# Patient Record
Sex: Female | Born: 1965 | Race: White | Hispanic: No | Marital: Married
Health system: Midwestern US, Community
[De-identification: ages and names within clinical notes are randomized; demographics above are authoritative.]

## PROBLEM LIST (undated history)

## (undated) DIAGNOSIS — R1011 Right upper quadrant pain: Secondary | ICD-10-CM

## (undated) DIAGNOSIS — K812 Acute cholecystitis with chronic cholecystitis: Secondary | ICD-10-CM

## (undated) DIAGNOSIS — N319 Neuromuscular dysfunction of bladder, unspecified: Secondary | ICD-10-CM

## (undated) DIAGNOSIS — K760 Fatty (change of) liver, not elsewhere classified: Secondary | ICD-10-CM

## (undated) DIAGNOSIS — K5792 Diverticulitis of intestine, part unspecified, without perforation or abscess without bleeding: Secondary | ICD-10-CM

## (undated) HISTORY — PX: COLON SURGERY: SHX602

## (undated) HISTORY — PX: CHOLECYSTECTOMY: SHX55

---

## 2019-06-03 ENCOUNTER — Inpatient Hospital Stay
Admit: 2019-06-03 | Discharge: 2019-06-04 | Disposition: A | Discharge status: Home / Self Care | Payer: PRIVATE HEALTH INSURANCE | Attending: Medical

## 2019-06-03 ENCOUNTER — Emergency Department: Admit: 2019-06-03 | Payer: PRIVATE HEALTH INSURANCE | Primary: Family Medicine

## 2019-06-03 DIAGNOSIS — K5900 Constipation, unspecified: Secondary | ICD-10-CM

## 2019-06-03 LAB — METABOLIC PANEL, COMPREHENSIVE
A-G Ratio: 0.8 — ABNORMAL LOW (ref 1.0–3.0)
ALT (SGPT): 32 U/L (ref 13–56)
AST (SGOT): 22 U/L (ref 15–37)
Albumin: 3.8 g/dL (ref 3.4–5.0)
Alk. phosphatase: 79 U/L (ref 45–117)
Anion gap: 7 mmol/L (ref 5–15)
BUN/Creatinine ratio: 15 (ref 12–20)
BUN: 15 MG/DL (ref 7–18)
Bilirubin, total: 0.3 mg/dL (ref 0.00–1.20)
CO2: 25 mmol/L (ref 21–32)
Calcium: 9.3 MG/DL (ref 8.5–10.1)
Chloride: 107 mmol/L (ref 98–110)
Creatinine: 1.03 MG/DL — ABNORMAL HIGH (ref 0.60–1.00)
GFR est AA: >60 mL/min/{1.73_m2} (ref >60)
GFR est non-AA: 59 mL/min/{1.73_m2} — ABNORMAL LOW (ref >60)
Glucose: 92 mg/dL (ref 70–100)
Potassium: 4.2 mmol/L (ref 3.5–5.1)
Protein, total: 8.5 g/dL — ABNORMAL HIGH (ref 6.4–8.2)
Sodium: 139 mmol/L (ref 136–145)

## 2019-06-03 LAB — UA WITH REFLEX MICRO AND CULTURE
Bilirubin: NEGATIVE
Glucose: NEGATIVE mg/dL
Ketone: NEGATIVE mg/dL
Nitrites: NEGATIVE
Protein: NEGATIVE mg/dL
Specific gravity: 1.005 (ref 1.001–1.035)
Urobilinogen: 0.2 EU/dL (ref 0.2–1.0)
pH (UA): 6 (ref 5–8)

## 2019-06-03 LAB — URINE MICROSCOPIC WITH REFLEX CULTURE
Hyaline Casts, UA: NONE SEEN /lpf
Hyaline cast: NONE SEEN /lpf

## 2019-06-03 LAB — CBC WITH AUTOMATED DIFF
ABS. BASOPHILS: 0 10*3/uL (ref 0.0–0.1)
ABS. EOSINOPHILS: 0.2 10*3/uL (ref 0.0–0.5)
ABS. IMM. GRANS.: 0 10*3/uL (ref 0.00–0.03)
ABS. LYMPHOCYTES: 2.1 10*3/uL (ref 1.2–3.7)
ABS. MONOCYTES: 0.9 10*3/uL — ABNORMAL HIGH (ref 0.2–0.8)
ABS. NEUTROPHILS: 5.3 10*3/uL (ref 1.6–6.1)
BASOPHILS: 0 % (ref 0–1.2)
EOSINOPHILS: 2 %
HCT: 39.5 % (ref 36.0–45.0)
HGB: 13.3 g/dL (ref 11.8–14.8)
IMMATURE GRANULOCYTES: 0 % (ref 0.0–0.4)
LYMPHOCYTES: 25 %
MCH: 31.1 PG (ref 25.6–32.2)
MCHC: 33.7 g/dL (ref 32.2–35.5)
MCV: 92.5 FL (ref 80.0–95.0)
MONOCYTES: 11 %
MPV: 9.4 FL (ref 9.4–12.4)
NEUTROPHILS: 62 %
PLATELET: 241 10*3/uL (ref 150–400)
RBC: 4.27 M/uL (ref 3.93–5.22)
RDW: 13.4 % (ref 11.6–14.4)
WBC: 8.5 10*3/uL (ref 4.0–10.0)

## 2019-06-03 LAB — LIPASE
Lipase: 179 U/L (ref 73–393)
Lipase: 179 U/L (ref 73–393)

## 2019-06-03 LAB — URINALYSIS WITH REFLEX TO CULTURE
Bilirubin, Urine: NEGATIVE
Glucose, Ur: NEGATIVE mg/dL
Ketones, Urine: NEGATIVE mg/dL
Nitrite, Urine: NEGATIVE
Protein, UA: NEGATIVE mg/dL
Specific Gravity, UA: 1.005 NA (ref 1.001–1.035)
Urobilinogen, UA, POCT: 0.2 EU/dL (ref 0.2–1.0)
pH, UA: 6 NA (ref 5–8)

## 2019-06-03 LAB — CBC WITH AUTO DIFFERENTIAL
Basophils %: 0 % (ref 0–1.2)
Basophils Absolute: 0 10*3/uL (ref 0.0–0.1)
Eosinophils %: 2 %
Eosinophils Absolute: 0.2 10*3/uL (ref 0.0–0.5)
Granulocyte Absolute Count: 0 10*3/uL (ref 0.00–0.03)
Hematocrit: 39.5 % (ref 36.0–45.0)
Hemoglobin: 13.3 g/dL (ref 11.8–14.8)
Immature Granulocytes: 0 % (ref 0.0–0.4)
Lymphocytes %: 25 %
Lymphocytes Absolute: 2.1 10*3/uL (ref 1.2–3.7)
MCH: 31.1 PG (ref 25.6–32.2)
MCHC: 33.7 g/dL (ref 32.2–35.5)
MCV: 92.5 FL (ref 80.0–95.0)
MPV: 9.4 FL (ref 9.4–12.4)
Monocytes %: 11 %
Monocytes Absolute: 0.9 10*3/uL — ABNORMAL HIGH (ref 0.2–0.8)
Neutrophils %: 62 %
Neutrophils Absolute: 5.3 10*3/uL (ref 1.6–6.1)
Platelets: 241 10*3/uL (ref 150–400)
RBC: 4.27 M/uL (ref 3.93–5.22)
RDW: 13.4 % (ref 11.6–14.4)
WBC: 8.5 10*3/uL (ref 4.0–10.0)

## 2019-06-03 LAB — COMPREHENSIVE METABOLIC PANEL
ALT: 32 U/L (ref 13–56)
AST: 22 U/L (ref 15–37)
Albumin/Globulin Ratio: 0.8 — ABNORMAL LOW (ref 1.0–3.0)
Albumin: 3.8 g/dL (ref 3.4–5.0)
Alkaline Phosphatase: 79 U/L (ref 45–117)
Anion Gap: 7 mmol/L (ref 5–15)
BUN: 15 MG/DL (ref 7–18)
Bun/Cre Ratio: 15 NA (ref 12–20)
CO2: 25 mmol/L (ref 21–32)
Calcium: 9.3 MG/DL (ref 8.5–10.1)
Chloride: 107 mmol/L (ref 98–110)
Creatinine: 1.03 MG/DL — ABNORMAL HIGH (ref 0.60–1.00)
EGFR IF NonAfrican American: 59 mL/min/{1.73_m2} — ABNORMAL LOW (ref >60)
GFR African American: >60 mL/min/{1.73_m2} (ref >60)
Glucose: 92 mg/dL (ref 70–100)
Potassium: 4.2 mmol/L (ref 3.5–5.1)
Sodium: 139 mmol/L (ref 136–145)
Total Bilirubin: 0.3 mg/dL (ref 0.00–1.20)
Total Protein: 8.5 g/dL — ABNORMAL HIGH (ref 6.4–8.2)

## 2019-06-03 MED ORDER — IOHEXOL 350 MG IODINE/ML INTRAVENOUS SOLUTION
350 mg iodine/mL | Freq: Once | INTRAVENOUS | Status: AC
Start: 2019-06-03 — End: 2019-06-03
  Administered 2019-06-03: 22:00:00 via INTRAVENOUS

## 2019-06-03 MED FILL — OMNIPAQUE 350 MG IODINE/ML INTRAVENOUS SOLUTION: 350 mg iodine/mL | INTRAVENOUS | Qty: 90

## 2019-06-03 NOTE — Progress Notes (Signed)
Message left for patient to call back

## 2019-06-03 NOTE — ED Notes (Signed)
PT reports near complete emptying after Miralax cleanse last Weds./Thrusday. PT states she had no BMs over the weekend, and only a small one today. HX diverticulosis with resection last year. PT reports abd pain since yesterday, RLQ, RUQ, and LLQ.     Labs sent per orders and PT updated on plan of care. No other needs at this time. Call light in reach.

## 2019-06-03 NOTE — ED Notes (Signed)
Verbal shift change report given to Christine RN (oncoming nurse) by Lynn RN (offgoing nurse). Report included the following information SBAR and ED Summary.

## 2019-06-03 NOTE — Progress Notes (Signed)
Patient has a neurogenic bladder and does self catheterize for urine.  Her ED visit was for constipation without urinary complaints and she was not placed on antibiotics.  Urine culture in progress.

## 2019-06-03 NOTE — ED Notes (Signed)
Discharge instructions reviewed with pt and pt verbalized understanding, denies any questions. PIV removed without issue. All belongings with pt and pt ambulatory out to front to drive self home.

## 2019-06-03 NOTE — ED Notes (Unsigned)
ED Provider Triage Note    Debra Oconnell, 53 y.o. female  Chief Complaint   Patient presents with   ??? Abdominal Pain         History  Pt is a 53yo F with PMH of diverticulitis who presents with complaints of abdominal pain for weeks. Reports that she has an xray last Monday and was told that she was constipated. Did miralax cleanse with some relief. Denies NVD, urinary sxs,or fevers.       There were no vitals taken for this visit.    Patient alert oriented x3 and in no acute distress  Head is atraumatic normocephalic.  Pupils equal round reactive to light.  Extraocular movements grossly intact.  Neck is supple with no tracheal deviation or JVD.  No respiratory distress, no retractions, normal respiratory rate.  Heart rate is normal   Abdomen TTP RUQ, RLQ, LUQ, and LLQ. Middle of abdomen (epigastric/hypogastric areas) are spared. No CVA TTP bilaterally.   Normally ambulation with no gross focal neurological deficits    Plan    Basic lab work including CBC/Chem7/LFTS. Urine analysis. May need further imaging as KUB vs CT.   No orders of the defined types were placed in this encounter.

## 2019-06-03 NOTE — ED Notes (Signed)
Patient here with complaints of 4/10 abdominal pain in lower abdomen and back since about couple a weeks ago. Patient states that she had a xray last Monday at [facility] and miralax cleanse on Thurs and Friday. She was told that she ws constipated, reports headache for last 2 days, groin pain and left leg pain. Patient states she has a history of diverticulitis.

## 2019-06-03 NOTE — ED Notes (Signed)
Assisting primary RN with discharge.

## 2019-06-03 NOTE — ED Provider Notes (Signed)
[facility]  Emergency Department     History of Present Illness:  53 year old female presenting ambulatory to the ED for evaluation of abdominal pain.  Patient states this has been ongoing for couple of weeks.  She was evaluated by her PCP a week ago, had an x-ray and was told she was constipated.  She therefore did a MiraLax cleanse with good result, however continues to have abdominal pain.  She states she has pain to her lower and upper abdomen with some back discomfort as well.  No fevers, chest pain or pressure, shortness of breath, cough, nausea, vomiting, urinary changes.  She has history of neurogenic bladder, self catheterizes.  She has not noticed any malodor or cloudiness.  She states she had 9 in of her colon resected secondary to diverticulitis in the past.  No postprandial pain    Past Medical History   No past medical history on file.   There is no problem list on file for this patient.     Past Surgical History   No past surgical history on file.     Social History   Social History     Tobacco Use   ??? Smoking status: Not on file   Substance Use Topics   ??? Alcohol use: Not on file      Social History     Substance and Sexual Activity   Drug Use Not on file      Family History   No family history on file.     Medications   Prior to Admission medications    Not on File     Allergies   No Known Allergies     ROS   As per HPI.  All other systems have been reviewed with the patient and are negative.       Physical Exam   VS:   Visit Vitals  BP 138/85   Pulse 84   Temp 97.5 ??F (36.4 ??C)   Resp 16   Ht 5\' 4"  (1.626 m)   Wt 86.2 kg (190 lb)   SpO2 97%   BMI 32.61 kg/m??      General: Patient is awake and alert. Patient is nontoxic appearing.   Head: The head is normocephalic and atraumatic.   ENT: Patient's airway is intact.   Neck: Supple.   Chest/Respiratory: Respiratory effort is normal.  Breath sounds are clear to auscultation bilaterally.  No wheezes or crackles.    Cardiovascular: The heart  has a regular rate and rhythm.  No murmurs.   GI/Abdomen:  Abdomen is soft nondistended.  Mild diffuse abdominal tenderness to upper and lower abdomen, sparing central abdomen.  Musculoskeletal: Full range of motion to all extremities. Patient does not have edema.   Skin: The patient's skin is intact. No rash.   Neurologic: The neurological exam shows no focal deficits.   Psychiatric:  The patient is cooperative.       Laboratory Testing   Labs Reviewed   CBC WITH AUTOMATED DIFF - Abnormal; Notable for the following components:       Result Value    ABS. MONOCYTES 0.9 (*)     All other components within normal limits   METABOLIC PANEL, COMPREHENSIVE - Abnormal; Notable for the following components:    Creatinine 1.03 (*)     GFR est non-AA 59 (*)     Protein, total 8.5 (*)     A-G Ratio 0.8 (*)     All other components within  normal limits   UA WITH REFLEX MICRO AND CULTURE - Abnormal; Notable for the following components:    Blood SMALL (*)     Leukocyte Esterase LARGE (*)     All other components within normal limits   URINE MICROSCOPIC WITH REFLEX CULTURE - Abnormal; Notable for the following components:    Bacteria TRACE (*)     All other components within normal limits   CULTURE, URINE   LIPASE       Radiology Testing   CT ABD PELV W CONT   Preliminary Result   1. MILD DIVERTICULOSIS.   2. MODERATE AMOUNT OF FECES IN THE COLON.   3. NO EVIDENCE OF ACUTE PATHOLOGY.         J:  [id number]   D:  06/03/2019 18:28:46   T:  06/03/2019 18:52:03                                           ED Course as of Jun 02 2238   Mon Jun 03, 2019   2003 CT abdomen pelvis shows mild diverticulosis without diverticulitis.  Moderate stools.  No other acute abnormality per radiologist, Dr. Si Gaul    [KM]   2011 Patient re-evaluated.  Resting comfortably.  Updated on labs and imaging.  Comfortable with plan for discharge    [KM]      ED Course User Index  [KM] Lucile Crater, [place]        MDM:  53 year old female presenting ambulatory to the  ED for evaluation of diffuse abdominal discomfort.  This has been worsening for the past couple of weeks.  She was evaluated a week ago, had x-ray which showed constipation.  She did a cleanse, continued to have this abdominal discomfort which prompted evaluation here.  She does have history of diverticulitis.  Here she is afebrile hemodynamically stable.  Was offered analgesics, declined.  Labs and imaging consistent with constipation.  No other acute findings.  She was educated about constipation measures.  She will follow up with her PCP and return if needed for worsening symptoms.  She is comfortable this plan ready for discharge well appearing in no acute distress      Diagnosis/Diagnoses     ICD-10-CM ICD-9-CM   1. Constipation, unspecified constipation type  K59.00 564.00

## 2019-06-03 NOTE — Progress Notes (Signed)
Patient called:  Will send prescription for Macrobid to [facility] in [place] instead of [facility].

## 2019-06-03 NOTE — Progress Notes (Signed)
Called and left message for pt to return call to the ED

## 2019-06-03 NOTE — ED Notes (Signed)
PT reports near complete emptying after Miralax cleanse last Weds./Thrusday. PT states she had no BMs over the weekend, and only a small one today. HX diverticulosis with resection last year. PT reports abd pain since yesterday, RLQ, RUQ, and LLQ.     Labs sent per orders and PT updated on plan of care. No other needs at this time. Call light in reach.

## 2019-06-03 NOTE — ED Triage Notes (Signed)
Patient here with complaints of 4/10 abdominal pain in lower abdomen and back since about couple a weeks ago. Patient states that she had a xray last Monday at [facility] and miralax cleanse on Thurs and Friday. She was told that she ws constipated, reports headache for last 2 days, groin pain and left leg pain. Patient states she has a history of diverticulitis.

## 2019-06-03 NOTE — Progress Notes (Signed)
Patient called:  Will send prescription for Macro[facility] to [facility] in [place] instead of [facility].

## 2019-06-03 NOTE — ED Provider Notes (Signed)
[facility]  Emergency Department     History of Present Illness:  53 year old female presenting ambulatory to the ED for evaluation of abdominal pain.  Patient states this has been ongoing for couple of weeks.  She was evaluated by her PCP a week ago, had an x-ray and was told she was constipated.  She therefore did a MiraLax cleanse with good result, however continues to have abdominal pain.  She states she has pain to her lower and upper abdomen with some back discomfort as well.  No fevers, chest pain or pressure, shortness of breath, cough, nausea, vomiting, urinary changes.  She has history of neurogenic bladder, self catheterizes.  She has not noticed any malodor or cloudiness.  She states she had 9 in of her colon resected secondary to diverticulitis in the past.  No postprandial pain    Past Medical History   No past medical history on file.   There is no problem list on file for this patient.     Past Surgical History   No past surgical history on file.     Social History   Social History     Tobacco Use   ??? Smoking status: Not on file   Substance Use Topics   ??? Alcohol use: Not on file      Social History     Substance and Sexual Activity   Drug Use Not on file      Family History   No family history on file.     Medications   Prior to Admission medications    Not on File     Allergies   No Known Allergies     ROS   As per HPI.  All other systems have been reviewed with the patient and are negative.       Physical Exam   VS:   Visit Vitals  BP 138/85   Pulse 84   Temp 97.5 ??F (36.4 ??C)   Resp 16   Ht 5\' 4"  (1.626 m)   Wt 86.2 kg (190 lb)   SpO2 97%   BMI 32.61 kg/m??      General: Patient is awake and alert. Patient is nontoxic appearing.   Head: The head is normocephalic and atraumatic.   ENT: Patient's airway is intact.   Neck: Supple.   Chest/Respiratory: Respiratory effort is normal.  Breath sounds are clear to auscultation bilaterally.  No wheezes or crackles.     Cardiovascular: The heart has a regular rate and rhythm.  No murmurs.   GI/Abdomen:  Abdomen is soft nondistended.  Mild diffuse abdominal tenderness to upper and lower abdomen, sparing central abdomen.  Musculoskeletal: Full range of motion to all extremities. Patient does not have edema.   Skin: The patient's skin is intact. No rash.   Neurologic: The neurological exam shows no focal deficits.   Psychiatric:  The patient is cooperative.       Laboratory Testing   Labs Reviewed   CBC WITH AUTOMATED DIFF - Abnormal; Notable for the following components:       Result Value    ABS. MONOCYTES 0.9 (*)     All other components within normal limits   METABOLIC PANEL, COMPREHENSIVE - Abnormal; Notable for the following components:    Creatinine 1.03 (*)     GFR est non-AA 59 (*)     Protein, total 8.5 (*)     A-G Ratio 0.8 (*)     All other components within  normal limits   UA WITH REFLEX MICRO AND CULTURE - Abnormal; Notable for the following components:    Blood SMALL (*)     Leukocyte Esterase LARGE (*)     All other components within normal limits   URINE MICROSCOPIC WITH REFLEX CULTURE - Abnormal; Notable for the following components:    Bacteria TRACE (*)     All other components within normal limits   CULTURE, URINE   LIPASE       Radiology Testing   CT ABD PELV W CONT   Preliminary Result   1. MILD DIVERTICULOSIS.   2. MODERATE AMOUNT OF FECES IN THE COLON.   3. NO EVIDENCE OF ACUTE PATHOLOGY.         J:  [id number]   D:  06/03/2019 18:28:46   T:  06/03/2019 18:52:03                                           ED Course as of Jun 02 2238   Mon Jun 03, 2019   2003 CT abdomen pelvis shows mild diverticulosis without diverticulitis.  Moderate stools.  No other acute abnormality per radiologist, Dr. Melanee Spry    [KM]   2011 Patient re-evaluated.  Resting comfortably.  Updated on labs and imaging.  Comfortable with plan for discharge    [KM]      ED Course User Index  [KM] Elnita Maxwell, [place]         MDM:  53 year old female presenting ambulatory to the ED for evaluation of diffuse abdominal discomfort.  This has been worsening for the past couple of weeks.  She was evaluated a week ago, had x-ray which showed constipation.  She did a cleanse, continued to have this abdominal discomfort which prompted evaluation here.  She does have history of diverticulitis.  Here she is afebrile hemodynamically stable.  Was offered analgesics, declined.  Labs and imaging consistent with constipation.  No other acute findings.  She was educated about constipation measures.  She will follow up with her PCP and return if needed for worsening symptoms.  She is comfortable this plan ready for discharge well appearing in no acute distress      Diagnosis/Diagnoses     ICD-10-CM ICD-9-CM   1. Constipation, unspecified constipation type  K59.00 564.00

## 2019-06-03 NOTE — ED Triage Notes (Cosign Needed)
ED Provider Triage Note    Servando Salina, 53 y.o. female  Chief Complaint   Patient presents with   ??? Abdominal Pain         History  Pt is a 53yo F with PMH of diverticulitis who presents with complaints of abdominal pain for weeks. Reports that she has an xray last Monday and was told that she was constipated. Did miralax cleanse with some relief. Denies NVD, urinary sxs,or fevers.       There were no vitals taken for this visit.    Patient alert oriented x3 and in no acute distress  Head is atraumatic normocephalic.  Pupils equal round reactive to light.  Extraocular movements grossly intact.  Neck is supple with no tracheal deviation or JVD.  No respiratory distress, no retractions, normal respiratory rate.  Heart rate is normal   Abdomen TTP RUQ, RLQ, LUQ, and LLQ. Middle of abdomen (epigastric/hypogastric areas) are spared. No CVA TTP bilaterally.   Normally ambulation with no gross focal neurological deficits    Plan    Basic lab work including CBC/Chem7/LFTS. Urine analysis. May need further imaging as KUB vs CT.   No orders of the defined types were placed in this encounter.

## 2019-06-03 NOTE — Progress Notes (Signed)
Patient has a neurogenic bladder and does self catheterize for urine.  Her ED visit was for constipation without urinary complaints and she was not placed on antibiotics.  Urine culture in progress.

## 2019-06-06 LAB — CULTURE, URINE

## 2019-06-07 MED ORDER — NITROFURANTOIN (25% MACROCRYSTAL FORM) 100 MG CAP
100 mg | ORAL_CAPSULE | Freq: Two times a day (BID) | ORAL | 0 refills | Status: DC
Start: 2019-06-07 — End: 2019-06-08

## 2019-06-08 MED ORDER — NITROFURANTOIN (25% MACROCRYSTAL FORM) 100 MG CAP
100 mg | ORAL_CAPSULE | Freq: Two times a day (BID) | ORAL | 0 refills | Status: AC
Start: 2019-06-08 — End: 2019-06-13

## 2019-06-25 ENCOUNTER — Inpatient Hospital Stay
Admit: 2019-06-25 | Discharge: 2019-06-25 | Disposition: A | Discharge status: Home / Self Care | Payer: PRIVATE HEALTH INSURANCE | Attending: Emergency Medicine

## 2019-06-25 ENCOUNTER — Emergency Department: Admit: 2019-06-25 | Payer: PRIVATE HEALTH INSURANCE | Primary: Family Medicine

## 2019-06-25 DIAGNOSIS — N3000 Acute cystitis without hematuria: Secondary | ICD-10-CM

## 2019-06-25 LAB — METABOLIC PANEL, COMPREHENSIVE
A-G Ratio: 0.8 — ABNORMAL LOW (ref 1.0–3.0)
ALT (SGPT): 96 U/L — ABNORMAL HIGH (ref 13–56)
AST (SGOT): 166 U/L — ABNORMAL HIGH (ref 15–37)
Albumin: 3.6 g/dL (ref 3.4–5.0)
Alk. phosphatase: 85 U/L (ref 45–117)
Anion gap: 10 mmol/L (ref 5–15)
BUN/Creatinine ratio: 13 (ref 12–20)
BUN: 16 MG/DL (ref 7–18)
Bilirubin, total: 0.52 mg/dL (ref 0.00–1.20)
CO2: 18 mmol/L — ABNORMAL LOW (ref 21–32)
Calcium: 9.1 MG/DL (ref 8.5–10.1)
Chloride: 112 mmol/L — ABNORMAL HIGH (ref 98–110)
Creatinine: 1.22 MG/DL — ABNORMAL HIGH (ref 0.60–1.00)
GFR est AA: 55 mL/min/{1.73_m2} — ABNORMAL LOW (ref >60)
GFR est non-AA: 49 mL/min/{1.73_m2} — ABNORMAL LOW (ref >60)
Glucose: 127 mg/dL — ABNORMAL HIGH (ref 70–100)
Potassium: 4 mmol/L (ref 3.5–5.1)
Protein, total: 7.9 g/dL (ref 6.4–8.2)
Sodium: 140 mmol/L (ref 136–145)

## 2019-06-25 LAB — CBC WITH AUTOMATED DIFF
ABS. BASOPHILS: 0.1 10*3/uL (ref 0.0–0.1)
ABS. EOSINOPHILS: 0.2 10*3/uL (ref 0.0–0.5)
ABS. IMM. GRANS.: 0 10*3/uL (ref 0.00–0.03)
ABS. LYMPHOCYTES: 2.1 10*3/uL (ref 1.2–3.7)
ABS. MONOCYTES: 0.6 10*3/uL (ref 0.2–0.8)
ABS. NEUTROPHILS: 4 10*3/uL (ref 1.6–6.1)
BASOPHILS: 1 % (ref 0–1.2)
EOSINOPHILS: 3 %
HCT: 43.2 % (ref 36.0–45.0)
HGB: 14.8 g/dL (ref 11.8–14.8)
IMMATURE GRANULOCYTES: 0 % (ref 0.0–0.4)
LYMPHOCYTES: 30 %
MCH: 31 PG (ref 25.6–32.2)
MCHC: 34.3 g/dL (ref 32.2–35.5)
MCV: 90.6 FL (ref 80.0–95.0)
MONOCYTES: 8 %
MPV: 9.5 FL (ref 9.4–12.4)
NEUTROPHILS: 58 %
PLATELET: 289 10*3/uL (ref 150–400)
RBC: 4.77 M/uL (ref 3.93–5.22)
RDW: 13.5 % (ref 11.6–14.4)
WBC: 6.9 10*3/uL (ref 4.0–10.0)

## 2019-06-25 LAB — UA WITH REFLEX MICRO AND CULTURE
Bilirubin: NEGATIVE
Glucose: NEGATIVE mg/dL
Ketone: NEGATIVE mg/dL
Nitrites: POSITIVE — AB
Protein: NEGATIVE mg/dL
Specific gravity: 1.019 (ref 1.001–1.035)
Urobilinogen: 0.2 EU/dL (ref 0.2–1.0)
pH (UA): 7.5 (ref 5–8)

## 2019-06-25 LAB — URINE MICROSCOPIC WITH REFLEX CULTURE
Casts UA: NONE SEEN /lpf
Casts: NONE SEEN /lpf
Hyaline Casts, UA: NONE SEEN /lpf
Hyaline cast: NONE SEEN /lpf

## 2019-06-25 LAB — LIPASE
Lipase: 275 U/L (ref 73–393)
Lipase: 275 U/L (ref 73–393)

## 2019-06-25 LAB — CBC WITH AUTO DIFFERENTIAL
Basophils %: 1 % (ref 0–1.2)
Basophils Absolute: 0.1 10*3/uL (ref 0.0–0.1)
Eosinophils %: 3 %
Eosinophils Absolute: 0.2 10*3/uL (ref 0.0–0.5)
Granulocyte Absolute Count: 0 10*3/uL (ref 0.00–0.03)
Hematocrit: 43.2 % (ref 36.0–45.0)
Hemoglobin: 14.8 g/dL (ref 11.8–14.8)
Immature Granulocytes: 0 % (ref 0.0–0.4)
Lymphocytes %: 30 %
Lymphocytes Absolute: 2.1 10*3/uL (ref 1.2–3.7)
MCH: 31 PG (ref 25.6–32.2)
MCHC: 34.3 g/dL (ref 32.2–35.5)
MCV: 90.6 FL (ref 80.0–95.0)
MPV: 9.5 FL (ref 9.4–12.4)
Monocytes %: 8 %
Monocytes Absolute: 0.6 10*3/uL (ref 0.2–0.8)
Neutrophils %: 58 %
Neutrophils Absolute: 4 10*3/uL (ref 1.6–6.1)
Platelets: 289 10*3/uL (ref 150–400)
RBC: 4.77 M/uL (ref 3.93–5.22)
RDW: 13.5 % (ref 11.6–14.4)
WBC: 6.9 10*3/uL (ref 4.0–10.0)

## 2019-06-25 LAB — URINALYSIS WITH REFLEX TO CULTURE
Bilirubin, Urine: NEGATIVE
Glucose, Ur: NEGATIVE mg/dL
Ketones, Urine: NEGATIVE mg/dL
Nitrite, Urine: POSITIVE — AB
Protein, UA: NEGATIVE mg/dL
Specific Gravity, UA: 1.019 NA (ref 1.001–1.035)
Urobilinogen, UA, POCT: 0.2 EU/dL (ref 0.2–1.0)
pH, UA: 7.5 NA (ref 5–8)

## 2019-06-25 LAB — COMPREHENSIVE METABOLIC PANEL
ALT: 96 U/L — ABNORMAL HIGH (ref 13–56)
AST: 166 U/L — ABNORMAL HIGH (ref 15–37)
Albumin/Globulin Ratio: 0.8 — ABNORMAL LOW (ref 1.0–3.0)
Albumin: 3.6 g/dL (ref 3.4–5.0)
Alkaline Phosphatase: 85 U/L (ref 45–117)
Anion Gap: 10 mmol/L (ref 5–15)
BUN: 16 MG/DL (ref 7–18)
Bun/Cre Ratio: 13 NA (ref 12–20)
CO2: 18 mmol/L — ABNORMAL LOW (ref 21–32)
Calcium: 9.1 MG/DL (ref 8.5–10.1)
Chloride: 112 mmol/L — ABNORMAL HIGH (ref 98–110)
Creatinine: 1.22 MG/DL — ABNORMAL HIGH (ref 0.60–1.00)
EGFR IF NonAfrican American: 49 mL/min/{1.73_m2} — ABNORMAL LOW (ref >60)
GFR African American: 55 mL/min/{1.73_m2} — ABNORMAL LOW (ref >60)
Glucose: 127 mg/dL — ABNORMAL HIGH (ref 70–100)
Potassium: 4 mmol/L (ref 3.5–5.1)
Sodium: 140 mmol/L (ref 136–145)
Total Bilirubin: 0.52 mg/dL (ref 0.00–1.20)
Total Protein: 7.9 g/dL (ref 6.4–8.2)

## 2019-06-25 MED ORDER — TRIMETHOPRIM-SULFAMETHOXAZOLE 160 MG-800 MG TAB
160-800 mg | ORAL | Status: AC
Start: 2019-06-25 — End: 2019-06-25
  Administered 2019-06-25: 17:00:00 via ORAL

## 2019-06-25 MED ORDER — MORPHINE 2 MG/ML INJECTION
2 mg/mL | Freq: Once | INTRAMUSCULAR | Status: AC
Start: 2019-06-25 — End: 2019-06-25
  Administered 2019-06-25: 13:00:00 via INTRAVENOUS

## 2019-06-25 MED ORDER — TRIMETHOPRIM-SULFAMETHOXAZOLE 160 MG-800 MG TAB
160-800 mg | ORAL_TABLET | Freq: Two times a day (BID) | ORAL | 0 refills | Status: AC
Start: 2019-06-25 — End: 2019-06-30

## 2019-06-25 MED ORDER — ONDANSETRON (PF) 4 MG/2 ML INJECTION
4 mg/2 mL | INTRAMUSCULAR | Status: AC
Start: 2019-06-25 — End: 2019-06-25
  Administered 2019-06-25: 13:00:00 via INTRAVENOUS

## 2019-06-25 MED ORDER — FAMOTIDINE (PF) 20 MG/2 ML IV
202 mg/2 mL | INTRAVENOUS | Status: AC
Start: 2019-06-25 — End: 2019-06-25
  Administered 2019-06-25: 13:00:00 via INTRAVENOUS

## 2019-06-25 MED ORDER — SODIUM CHLORIDE 0.9 % IJ SYRG
INTRAMUSCULAR | Status: DC | PRN
Start: 2019-06-25 — End: 2019-06-25

## 2019-06-25 MED ORDER — SODIUM CHLORIDE 0.9 % IJ SYRG
Freq: Three times a day (TID) | INTRAMUSCULAR | Status: DC
Start: 2019-06-25 — End: 2019-06-25

## 2019-06-25 MED ORDER — SUCRALFATE 100 MG/ML ORAL SUSP
100 mg/mL | Freq: Four times a day (QID) | ORAL | 0 refills | Status: AC
Start: 2019-06-25 — End: ?

## 2019-06-25 MED FILL — MORPHINE 2 MG/ML INJECTION: 2 mg/mL | INTRAMUSCULAR | Qty: 2

## 2019-06-25 MED FILL — TRIMETHOPRIM-SULFAMETHOXAZOLE 160 MG-800 MG TAB: 160-800 mg | ORAL | Qty: 1

## 2019-06-25 MED FILL — NORMAL SALINE FLUSH 0.9 % INJECTION SYRINGE: INTRAMUSCULAR | Qty: 10

## 2019-06-25 MED FILL — ONDANSETRON (PF) 4 MG/2 ML INJECTION: 4 mg/2 mL | INTRAMUSCULAR | Qty: 2

## 2019-06-25 MED FILL — FAMOTIDINE (PF) 20 MG/2 ML IV: 20 mg/2 mL | INTRAVENOUS | Qty: 2

## 2019-06-25 NOTE — ED Provider Notes (Signed)
53 yr old female with h /o neurogenic bladder (straight caths) and previous colon resection from diverticulitis, presents to the ED with RUQ abd pain that started about 1.5 hours ago. Pain is sharp and constant. She reports pain wraps around to her back. This is associated with nausea, no vomiting, no changes in bowel or bladder. She was seen here 2 weeks ago with similar pain, however this pain is much worse and that pain was lower in her abdomen. She did have a CT scan of the abd/pelvis which showed moderate stool. She was found to have a UTI at that time. She was educated on treatment for constipation and started on macrobid. She has been doing well since that time until this am. She did take the course of macrobid. She does not think that was helpful for changes in her urine which she does report is foul smelling. She has not taken any medications for the pain this am. No recent travel or illness. No sick contacts. Pt denies trauma. Pt denies ETOH or drug use. Pt denies HA, confusion, fevers, visual changes, neck pain, cough, CP, SOB. No recent weight loss, LAD, or rashes.              No past medical history on file.    No past surgical history on file.      No family history on file.    Social History     Socioeconomic History   ??? Marital status: MARRIED     Spouse name: Not on file   ??? Number of children: Not on file   ??? Years of education: Not on file   ??? Highest education level: Not on file   Occupational History   ??? Not on file   Social Needs   ??? Financial resource strain: Not on file   ??? Food insecurity     Worry: Not on file     Inability: Not on file   ??? Transportation needs     Medical: Not on file     Non-medical: Not on file   Tobacco Use   ??? Smoking status: Not on file   Substance and Sexual Activity   ??? Alcohol use: Not on file   ??? Drug use: Not on file   ??? Sexual activity: Not on file   Lifestyle   ??? Physical activity     Days per week: Not on file     Minutes per session: Not on file   ??? Stress:  Not on file   Relationships   ??? Social Product manager on phone: Not on file     Gets together: Not on file     Attends religious service: Not on file     Active member of club or organization: Not on file     Attends meetings of clubs or organizations: Not on file     Relationship status: Not on file   ??? Intimate partner violence     Fear of current or ex partner: Not on file     Emotionally abused: Not on file     Physically abused: Not on file     Forced sexual activity: Not on file   Other Topics Concern   ??? Not on file   Social History Narrative   ??? Not on file         ALLERGIES: Patient has no known allergies.    Review of Systems   Constitutional: Negative for activity  change, appetite change, chills, diaphoresis, fatigue, fever and unexpected weight change.   HENT: Negative for congestion, sneezing, sore throat and trouble swallowing.    Eyes: Negative for photophobia and visual disturbance.   Respiratory: Negative for cough, chest tightness and shortness of breath.    Cardiovascular: Negative for chest pain, palpitations and leg swelling.   Gastrointestinal: Positive for abdominal pain and nausea. Negative for blood in stool, constipation, diarrhea and vomiting.   Genitourinary: Negative for difficulty urinating, dysuria, flank pain, frequency, hematuria and urgency.   Musculoskeletal: Negative for back pain, joint swelling, neck pain and neck stiffness.   Skin: Negative for rash and wound.   Allergic/Immunologic: Negative.    Neurological: Negative for dizziness, syncope, weakness, light-headedness, numbness and headaches.   Hematological: Negative.    Psychiatric/Behavioral: Negative for confusion, sleep disturbance and suicidal ideas.       Vitals:    06/25/19 0743   BP: (!) 185/85   Pulse: 73   Resp: 25   Temp: 97.5 ??F (36.4 ??C)   SpO2: 98%   Weight: 86.2 kg (190 lb)   Height: '5\' 4"'  (1.626 m)            Physical Exam  Vitals signs reviewed.   Constitutional:       General: She is in acute  distress.      Appearance: She is well-developed. She is not ill-appearing, toxic-appearing or diaphoretic.   HENT:      Head: Atraumatic.      Right Ear: External ear normal.      Left Ear: External ear normal.      Mouth/Throat:      Mouth: Mucous membranes are moist.      Pharynx: No oropharyngeal exudate.   Eyes:      Extraocular Movements: Extraocular movements intact.      Conjunctiva/sclera: Conjunctivae normal.      Pupils: Pupils are equal, round, and reactive to light.   Neck:      Musculoskeletal: Normal range of motion and neck supple.   Cardiovascular:      Rate and Rhythm: Normal rate and regular rhythm.      Heart sounds: Normal heart sounds. No murmur.   Pulmonary:      Effort: Pulmonary effort is normal. No respiratory distress.      Breath sounds: Normal breath sounds. No wheezing or rales.   Abdominal:      General: Bowel sounds are decreased. There is no distension.      Palpations: Abdomen is soft. There is no mass.      Tenderness: There is abdominal tenderness (mild). There is no guarding or rebound.   Musculoskeletal: Normal range of motion.         General: No tenderness or deformity.   Lymphadenopathy:      Cervical: No cervical adenopathy.   Skin:     General: Skin is warm and dry.      Findings: No erythema or rash.   Neurological:      General: No focal deficit present.      Mental Status: She is alert and oriented to person, place, and time.      Cranial Nerves: No cranial nerve deficit.      Sensory: No sensory deficit.      Motor: No weakness.      Coordination: Coordination normal.      Deep Tendon Reflexes: Reflexes normal.   Psychiatric:         Behavior: Behavior normal.  Thought Content: Thought content normal.         Judgment: Judgment normal.          MDM  Number of Diagnoses or Management Options  Diagnosis management comments: 53 yr old female presents to ED with RUQ abd pain that started 3 days ago. Pain came on suddenly about 1.5 hours ago.  Pt is afebrile,  uncomfortable but non toxic in appearance with HTN, otherwise nl vital signs. She is alert with no AMS.   On exam, abd is soft with mild discomfort to palpation of the RUQ, no peritoneal signs making an acute abdomen unlikely. Physical exam is otherwise unremarkable.   IV access obtained and pt was started on NS. She was given morphine, pepcid and zofran for pain and nausea control.   Labs show no leukocytosis, no anemia, nl electrolytes. She has mild metabolic acidosis with a CO2 of 18. Cr is 1.22. AST is 122, ALT is 96, nl TB at 0.52. Alk phos is 85. Lipase is normal at 275.   Urine  RUQ US shows a positive murphy's sign, however no GB wall thickening or gallstones. CBD is mildly distended at 72m.     Discussed with Dr. SClide Dalesfrom general surgery who evaluated the pt here in the ED and feels pt can be discharged home to follow up with him in clinic. He recommends the pt be started on carafate. He discussed all findings and recommendations with her at length. He has also recommended GI follow up for her.      On repeat exam, she feels much improved.   Urine is positive for UTI. Will treat with bactrim as she was treated with macribid previously.   Vital signs are normal and abd is soft and non tender. Based  on this, I feel pt is safe to be discharged home with follow up with surgery and GI.  Pt given strong return precautions for fever, nausea/vomiting or increased pain. Also advised pt to have abd reevaluated in 24 hours if symptoms persist or sooner if needed. Pt discharged home in good condition. All indications for emergent re-evaluation and possible medication side effects discussed with pt prior to discharge and she expressed understanding.             Amount and/or Complexity of Data Reviewed  Clinical lab tests: reviewed  Review and summarize past medical records: yes  Discuss the patient with other providers: yes           Procedures      NIH Stroke Scale

## 2019-06-25 NOTE — Progress Notes (Signed)
Pt was started on bactrim, susceptibilities to follow

## 2019-06-25 NOTE — ED Notes (Signed)
[place] tech at bedside. Pt appears more comfortable.

## 2019-06-25 NOTE — Progress Notes (Signed)
General Surgery Consultation  Date/Time: 06/25/19, 11:00 AM  Location: [facility] Emergency Room # 7    HISTORY OF PRESENT ILLNESS  Debra Oconnell is a 53 y.o. female.  Debra Oconnell is a pleasant 53 yo female who presents to emergency room for complaint of epigastric/right upper quadrant abdominal pain of onset early this morning.  She has been seen by her primary care physician, as well as gastroenterologist, and most recently Dr.Abbis, for this similar concern.  Those notes in her chart have been reviewed and appreciated.    The pain is sharp in nature, radiating to the back, not associated with nausea or vomiting.  No fever or chills.  No other members of her family have been ill.  Due to her neurogenic bladder, the patient frequently self catheterizes, and states she noticed malodor to her urine, which may be indicative of UTI symptoms.  She does admit to frequent constipation, however stays well hydrated.  He has tried some stool softeners, but does not have a strict regimen yet.     She denies the pain being immediate onset postprandially, but due to symptoms of gastric reflux has recently been started on a course of proton pump inhibitor.  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  Of note she has a sister, whom she has states has liver failure, as well as Crohn's disease.  The patient has not had any recent endoscopy, although has had colonoscopy in the past surrounding her issue of diverticulitis, for which she has undergone left hemicolectomy.      Review of Systems   Constitutional: Negative for chills, diaphoresis, fever, malaise/fatigue and weight loss.   Respiratory: Negative for cough and shortness of breath.    Cardiovascular: Negative for chest pain and leg swelling.   Gastrointestinal: Positive for abdominal pain, constipation and heartburn. Negative for blood in  stool, diarrhea, melena, nausea and vomiting.   Genitourinary: Negative for dysuria.   Musculoskeletal: Negative for myalgias.   Neurological: Negative for weakness.     Labs reviewed from ER visit today on 06/25/19:  Normal WBC, no left shift  Urinalysis shows 4+ bacteriurea  Lipase normal, total bilirubin normal, alkaline phosphatase normal.  ALT 96, AST 166  Mildly elevated creatinine 1.22    Ultrasound right upper quadrant abdomen (I reviewed the images in person with Dr. Si GaulHu)  Mild dilation of common bile duct at 8 mm,  Normal gallbladder wall thickening, no pericholecystic fluid  There is no evidence of cholelithiasis or biliary sludge.    Physical Exam  Constitutional:       General: She is not in acute distress.     Appearance: Normal appearance. She is not toxic-appearing or diaphoretic.   HENT:      Head: Normocephalic and atraumatic.      Nose: No congestion.   Eyes:      General: No scleral icterus.     Pupils: Pupils are equal, round, and reactive to light.   Cardiovascular:      Rate and Rhythm: Regular rhythm.      Pulses: Normal pulses.   Pulmonary:      Breath sounds: Normal breath sounds. No wheezing.   Abdominal:      General: Abdomen is flat. There is no distension.      Palpations: Abdomen is soft. There is no mass.      Tenderness: There is  abdominal tenderness (Right upper quadrant, epigastrium to deep palpation.). There is no right CVA tenderness, left CVA tenderness, guarding or rebound.   Musculoskeletal:         General: No swelling.   Skin:     General: Skin is warm and dry.   Neurological:      General: No focal deficit present.      Mental Status: She is alert and oriented to person, place, and time.   Psychiatric:         Behavior: Behavior normal.         ASSESSMENT and PLAN  53 year old female with epigastric and right upper quadrant abdominal pain, likely due to 1 of several possible etiologies, and differential diagnosis includes peptic ulcer disease, Crohn's disease, biliary  dyskinesia.  There is no evidence of cholelithiasis or cholecystitis on today's evaluation.  Given the patient's prior history of colon surgery, and known constipation with neurogenic bladder issues, I suspect these are less likely related to her presentation today.    I discussed with the patient's the importance of continued workup with Gastroenterology, to involve EGD, although she has recently started a course of PPI, which she should continue.  Symptomatic relief with a trial of Carafate may be of some benefit.  More concerning is the family history of Crohn's disease, and a formal workup to include biopsy would be helpful to rule this out in this patient, especially given the age of presentation.  Lastly the role of avoiding fatty foods to minimize her symptoms, should they be biliary in nature due to any possible biliary dyskinesia would be prudent.  I explained to the patient that if her pain recurs and or persists, she should seek re-evaluation, or call our office to be further worked up electively.  I do not find indication for emergency surgery to remove her gallbladder at this time.    I spent 30 minutes counseling and advising the patient on the above diagnoses, and all her questions were answered to her satisfaction.  She understands with the plan and agrees to follow-up in the outpatient clinic.    Thank you for the opportunity to be involved in the care of this patient.

## 2019-06-25 NOTE — ED Provider Notes (Signed)
53 yr old female with h /o neurogenic bladder (straight caths) and previous colon resection from diverticulitis, presents to the ED with RUQ abd pain that started about 1.5 hours ago. Pain is sharp and constant. She reports pain wraps around to her back. This is associated with nausea, no vomiting, no changes in bowel or bladder. She was seen here 2 weeks ago with similar pain, however this pain is much worse and that pain was lower in her abdomen. She did have a CT scan of the abd/pelvis which showed moderate stool. She was found to have a UTI at that time. She was educated on treatment for constipation and started on macrobid. She has been doing well since that time until this am. She did take the course of macrobid. She does not think that was helpful for changes in her urine which she does report is foul smelling. She has not taken any medications for the pain this am. No recent travel or illness. No sick contacts. Pt denies trauma. Pt denies ETOH or drug use. Pt denies HA, confusion, fevers, visual changes, neck pain, cough, CP, SOB. No recent weight loss, LAD, or rashes.              No past medical history on file.    No past surgical history on file.      No family history on file.    Social History     Socioeconomic History   ??? Marital status: MARRIED     Spouse name: Not on file   ??? Number of children: Not on file   ??? Years of education: Not on file   ??? Highest education level: Not on file   Occupational History   ??? Not on file   Social Needs   ??? Financial resource strain: Not on file   ??? Food insecurity     Worry: Not on file     Inability: Not on file   ??? Transportation needs     Medical: Not on file     Non-medical: Not on file   Tobacco Use   ??? Smoking status: Not on file   Substance and Sexual Activity   ??? Alcohol use: Not on file   ??? Drug use: Not on file   ??? Sexual activity: Not on file   Lifestyle   ??? Physical activity     Days per week: Not on file     Minutes per session: Not on file    ??? Stress: Not on file   Relationships   ??? Social Product manager on phone: Not on file     Gets together: Not on file     Attends religious service: Not on file     Active member of club or organization: Not on file     Attends meetings of clubs or organizations: Not on file     Relationship status: Not on file   ??? Intimate partner violence     Fear of current or ex partner: Not on file     Emotionally abused: Not on file     Physically abused: Not on file     Forced sexual activity: Not on file   Other Topics Concern   ??? Not on file   Social History Narrative   ??? Not on file         ALLERGIES: Patient has no known allergies.    Review of Systems   Constitutional: Negative for activity  change, appetite change, chills, diaphoresis, fatigue, fever and unexpected weight change.   HENT: Negative for congestion, sneezing, sore throat and trouble swallowing.    Eyes: Negative for photophobia and visual disturbance.   Respiratory: Negative for cough, chest tightness and shortness of breath.    Cardiovascular: Negative for chest pain, palpitations and leg swelling.   Gastrointestinal: Positive for abdominal pain and nausea. Negative for blood in stool, constipation, diarrhea and vomiting.   Genitourinary: Negative for difficulty urinating, dysuria, flank pain, frequency, hematuria and urgency.   Musculoskeletal: Negative for back pain, joint swelling, neck pain and neck stiffness.   Skin: Negative for rash and wound.   Allergic/Immunologic: Negative.    Neurological: Negative for dizziness, syncope, weakness, light-headedness, numbness and headaches.   Hematological: Negative.    Psychiatric/Behavioral: Negative for confusion, sleep disturbance and suicidal ideas.       Vitals:    06/25/19 0743   BP: (!) 185/85   Pulse: 73   Resp: 25   Temp: 97.5 ??F (36.4 ??C)   SpO2: 98%   Weight: 86.2 kg (190 lb)   Height: 5' 4" (1.626 m)            Physical Exam  Vitals signs reviewed.   Constitutional:        General: She is in acute distress.      Appearance: She is well-developed. She is not ill-appearing, toxic-appearing or diaphoretic.   HENT:      Head: Atraumatic.      Right Ear: External ear normal.      Left Ear: External ear normal.      Mouth/Throat:      Mouth: Mucous membranes are moist.      Pharynx: No oropharyngeal exudate.   Eyes:      Extraocular Movements: Extraocular movements intact.      Conjunctiva/sclera: Conjunctivae normal.      Pupils: Pupils are equal, round, and reactive to light.   Neck:      Musculoskeletal: Normal range of motion and neck supple.   Cardiovascular:      Rate and Rhythm: Normal rate and regular rhythm.      Heart sounds: Normal heart sounds. No murmur.   Pulmonary:      Effort: Pulmonary effort is normal. No respiratory distress.      Breath sounds: Normal breath sounds. No wheezing or rales.   Abdominal:      General: Bowel sounds are decreased. There is no distension.      Palpations: Abdomen is soft. There is no mass.      Tenderness: There is abdominal tenderness (mild). There is no guarding or rebound.   Musculoskeletal: Normal range of motion.         General: No tenderness or deformity.   Lymphadenopathy:      Cervical: No cervical adenopathy.   Skin:     General: Skin is warm and dry.      Findings: No erythema or rash.   Neurological:      General: No focal deficit present.      Mental Status: She is alert and oriented to person, place, and time.      Cranial Nerves: No cranial nerve deficit.      Sensory: No sensory deficit.      Motor: No weakness.      Coordination: Coordination normal.      Deep Tendon Reflexes: Reflexes normal.   Psychiatric:         Behavior: Behavior normal.  Thought Content: Thought content normal.         Judgment: Judgment normal.          MDM  Number of Diagnoses or Management Options  Diagnosis management comments: 53 yr old female presents to ED with RUQ abd pain that started 3 days ago. Pain came on suddenly about 1.5 hours  ago.  Pt is afebrile, uncomfortable but non toxic in appearance with HTN, otherwise nl vital signs. She is alert with no AMS.   On exam, abd is soft with mild discomfort to palpation of the RUQ, no peritoneal signs making an acute abdomen unlikely. Physical exam is otherwise unremarkable.   IV access obtained and pt was started on NS. She was given morphine, pepcid and zofran for pain and nausea control.   Labs show no leukocytosis, no anemia, nl electrolytes. She has mild metabolic acidosis with a CO2 of 18. Cr is 1.22. AST is 122, ALT is 96, nl TB at 0.52. Alk phos is 85. Lipase is normal at 275.   Urine  RUQ US shows a positive murphy's sign, however no GB wall thickening or gallstones. CBD is mildly distended at 70m.     Discussed with Dr. SClide Dalesfrom general surgery who evaluated the pt here in the ED and feels pt can be discharged home to follow up with him in clinic. He recommends the pt be started on carafate. He discussed all findings and recommendations with her at length. He has also recommended GI follow up for her.      On repeat exam, she feels much improved.   Urine is positive for UTI. Will treat with bactrim as she was treated with macribid previously.   Vital signs are normal and abd is soft and non tender. Based  on this, I feel pt is safe to be discharged home with follow up with surgery and GI.  Pt given strong return precautions for fever, nausea/vomiting or increased pain. Also advised pt to have abd reevaluated in 24 hours if symptoms persist or sooner if needed. Pt discharged home in good condition. All indications for emergent re-evaluation and possible medication side effects discussed with pt prior to discharge and she expressed understanding.             Amount and/or Complexity of Data Reviewed  Clinical lab tests: reviewed  Review and summarize past medical records: yes  Discuss the patient with other providers: yes           Procedures      NIH Stroke Scale

## 2019-06-25 NOTE — ED Notes (Signed)
US tech at bedside. Pt appears more comfortable.

## 2019-06-25 NOTE — Progress Notes (Signed)
General Surgery Consultation  Date/Time: 06/25/19, 11:00 AM  Location: [facility] Emergency Room # 7    HISTORY OF PRESENT ILLNESS  Debra Oconnell is a 53 y.o. female.  Debra Oconnell is a pleasant 53 yo female who presents to emergency room for complaint of epigastric/right upper quadrant abdominal pain of onset early this morning.  She has been seen by her primary care physician, as well as gastroenterologist, and most recently Dr.Abbis, for this similar concern.  Those notes in her chart have been reviewed and appreciated.    The pain is sharp in nature, radiating to the back, not associated with nausea or vomiting.  No fever or chills.  No other members of her family have been ill.  Due to her neurogenic bladder, the patient frequently self catheterizes, and states she noticed malodor to her urine, which may be indicative of UTI symptoms.  She does admit to frequent constipation, however stays well hydrated.  He has tried some stool softeners, but does not have a strict regimen yet.     She denies the pain being immediate onset postprandially, but due to symptoms of gastric reflux has recently been started on a course of proton pump inhibitor.  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  Of note she has a sister, whom she has states has liver failure, as well as Crohn's disease.  The patient has not had any recent endoscopy, although has had colonoscopy in the past surrounding her issue of diverticulitis, for which she has undergone left hemicolectomy.      Review of Systems   Constitutional: Negative for chills, diaphoresis, fever, malaise/fatigue and weight loss.   Respiratory: Negative for cough and shortness of breath.    Cardiovascular: Negative for chest pain and leg swelling.   Gastrointestinal: Positive for abdominal pain, constipation and heartburn.  Negative for blood in stool, diarrhea, melena, nausea and vomiting.   Genitourinary: Negative for dysuria.   Musculoskeletal: Negative for myalgias.   Neurological: Negative for weakness.     Labs reviewed from ER visit today on 06/25/19:  Normal WBC, no left shift  Urinalysis shows 4+ bacteriurea  Lipase normal, total bilirubin normal, alkaline phosphatase normal.  ALT 96, AST 166  Mildly elevated creatinine 1.22    Ultrasound right upper quadrant abdomen (I reviewed the images in person with Dr. Melanee Spry)  Mild dilation of common bile duct at 8 mm,  Normal gallbladder wall thickening, no pericholecystic fluid  There is no evidence of cholelithiasis or biliary sludge.    Physical Exam  Constitutional:       General: She is not in acute distress.     Appearance: Normal appearance. She is not toxic-appearing or diaphoretic.   HENT:      Head: Normocephalic and atraumatic.      Nose: No congestion.   Eyes:      General: No scleral icterus.     Pupils: Pupils are equal, round, and reactive to light.   Cardiovascular:      Rate and Rhythm: Regular rhythm.      Pulses: Normal pulses.   Pulmonary:      Breath sounds: Normal breath sounds. No wheezing.   Abdominal:      General: Abdomen is flat. There is no distension.      Palpations: Abdomen is soft. There is no mass.      Tenderness: There is  abdominal tenderness (Right upper quadrant, epigastrium to deep palpation.). There is no right CVA tenderness, left CVA tenderness, guarding or rebound.   Musculoskeletal:         General: No swelling.   Skin:     General: Skin is warm and dry.   Neurological:      General: No focal deficit present.      Mental Status: She is alert and oriented to person, place, and time.   Psychiatric:         Behavior: Behavior normal.         ASSESSMENT and PLAN  53 year old female with epigastric and right upper quadrant abdominal pain, likely due to 1 of several possible etiologies, and differential  diagnosis includes peptic ulcer disease, Crohn's disease, biliary dyskinesia.  There is no evidence of cholelithiasis or cholecystitis on today's evaluation.  Given the patient's prior history of colon surgery, and known constipation with neurogenic bladder issues, I suspect these are less likely related to her presentation today.    I discussed with the patient's the importance of continued workup with Gastroenterology, to involve EGD, although she has recently started a course of PPI, which she should continue.  Symptomatic relief with a trial of Carafate may be of some benefit.  More concerning is the family history of Crohn's disease, and a formal workup to include biopsy would be helpful to rule this out in this patient, especially given the age of presentation.  Lastly the role of avoiding fatty foods to minimize her symptoms, should they be biliary in nature due to any possible biliary dyskinesia would be prudent.  I explained to the patient that if her pain recurs and or persists, she should seek re-evaluation, or call our office to be further worked up electively.  I do not find indication for emergency surgery to remove her gallbladder at this time.    I spent 30 minutes counseling and advising the patient on the above diagnoses, and all her questions were answered to her satisfaction.  She understands with the plan and agrees to follow-up in the outpatient clinic.    Thank you for the opportunity to be involved in the care of this patient.

## 2019-06-27 LAB — CULTURE, URINE: Culture result:: >100000 — AB

## 2019-07-30 ENCOUNTER — Ambulatory Visit: Admit: 2019-07-30 | Payer: PRIVATE HEALTH INSURANCE | Primary: Family Medicine

## 2019-07-30 ENCOUNTER — Inpatient Hospital Stay: Payer: PRIVATE HEALTH INSURANCE

## 2019-07-30 MED ORDER — LIDOCAINE (PF) 20 MG/ML (2 %) IJ SOLN
20 mg/mL (2 %) | INTRAMUSCULAR | Status: AC
Start: 2019-07-30 — End: ?

## 2019-07-30 MED ORDER — PHENYLEPHRINE 10 MG/ML INJECTION
10 mg/mL | INTRAMUSCULAR | Status: DC | PRN
Start: 2019-07-30 — End: 2019-07-30
  Administered 2019-07-30 (×2): via INTRAVENOUS

## 2019-07-30 MED ORDER — DEXAMETHASONE SODIUM PHOSPHATE 4 MG/ML IJ SOLN
4 mg/mL | INTRAMUSCULAR | Status: AC
Start: 2019-07-30 — End: ?

## 2019-07-30 MED ORDER — LIDOCAINE-PRILOCAINE 2.5 %-2.5 % TOPICAL CREAM
Freq: Once | CUTANEOUS | Status: DC
Start: 2019-07-30 — End: 2019-07-30

## 2019-07-30 MED ORDER — METOCLOPRAMIDE 5 MG/ML IJ SOLN
5 mg/mL | Freq: Once | INTRAMUSCULAR | Status: AC | PRN
Start: 2019-07-30 — End: 2019-07-30
  Administered 2019-07-30: 23:00:00 via INTRAVENOUS

## 2019-07-30 MED ORDER — SUGAMMADEX 100 MG/ML INTRAVENOUS SOLUTION
100 mg/mL | INTRAVENOUS | Status: DC | PRN
Start: 2019-07-30 — End: 2019-07-30
  Administered 2019-07-30: 22:00:00 via INTRAVENOUS

## 2019-07-30 MED ORDER — LIDOCAINE HCL 2 % (20 MG/ML) IJ SOLN
20 mg/mL (2 %) | INTRAMUSCULAR | Status: DC | PRN
Start: 2019-07-30 — End: 2019-07-30
  Administered 2019-07-30: 19:00:00 via INTRAVENOUS

## 2019-07-30 MED ORDER — SODIUM CHLORIDE 0.9 % IJ SYRG
Freq: Two times a day (BID) | INTRAMUSCULAR | Status: DC
Start: 2019-07-30 — End: 2019-07-30

## 2019-07-30 MED ORDER — GLUCAGON 1 MG INJECTION
1 mg | INTRAMUSCULAR | Status: AC
Start: 2019-07-30 — End: 2019-07-30

## 2019-07-30 MED ORDER — GLUCAGON 1 MG INJECTION
1 mg | INTRAMUSCULAR | Status: DC | PRN
Start: 2019-07-30 — End: 2019-07-30
  Administered 2019-07-30: 21:00:00 via INTRAVENOUS

## 2019-07-30 MED ORDER — DEXAMETHASONE SODIUM PHOSPHATE 4 MG/ML IJ SOLN
4 mg/mL | INTRAMUSCULAR | Status: DC | PRN
Start: 2019-07-30 — End: 2019-07-30
  Administered 2019-07-30: 20:00:00 via INTRAVENOUS

## 2019-07-30 MED ORDER — FENTANYL CITRATE (PF) 50 MCG/ML IJ SOLN
50 mcg/mL | INTRAMUSCULAR | Status: AC
Start: 2019-07-30 — End: ?

## 2019-07-30 MED ORDER — ROCURONIUM 10 MG/ML IV
10 mg/mL | INTRAVENOUS | Status: AC
Start: 2019-07-30 — End: ?

## 2019-07-30 MED ORDER — ACETAMINOPHEN 500 MG TAB
500 mg | Freq: Once | ORAL | Status: AC
Start: 2019-07-30 — End: 2019-07-30
  Administered 2019-07-30: 18:00:00 via ORAL

## 2019-07-30 MED ORDER — PROPOFOL 10 MG/ML IV EMUL
10 mg/mL | INTRAVENOUS | Status: AC
Start: 2019-07-30 — End: ?

## 2019-07-30 MED ORDER — FENTANYL CITRATE (PF) 50 MCG/ML IJ SOLN
50 mcg/mL | INTRAMUSCULAR | Status: DC | PRN
Start: 2019-07-30 — End: 2019-07-30
  Administered 2019-07-30 (×3): via INTRAVENOUS

## 2019-07-30 MED ORDER — ACETAMINOPHEN 500 MG TAB
500 mg | ORAL_TABLET | ORAL | 0 refills | Status: AC | PRN
Start: 2019-07-30 — End: ?

## 2019-07-30 MED ORDER — KETOROLAC TROMETHAMINE 30 MG/ML INJECTION
30 mg/mL (1 mL) | INTRAMUSCULAR | Status: AC
Start: 2019-07-30 — End: ?

## 2019-07-30 MED ORDER — OXYCODONE 5 MG CAP
5 mg | ORAL_CAPSULE | ORAL | 0 refills | Status: AC | PRN
Start: 2019-07-30 — End: 2019-08-02

## 2019-07-30 MED ORDER — PROPOFOL 10 MG/ML IV EMUL
10 mg/mL | INTRAVENOUS | Status: DC | PRN
Start: 2019-07-30 — End: 2019-07-30
  Administered 2019-07-30: 19:00:00 via INTRAVENOUS

## 2019-07-30 MED ORDER — MIDAZOLAM 1 MG/ML IJ SOLN
1 mg/mL | INTRAMUSCULAR | Status: AC
Start: 2019-07-30 — End: ?

## 2019-07-30 MED ORDER — GABAPENTIN 300 MG CAP
300 mg | Freq: Once | ORAL | Status: AC | PRN
Start: 2019-07-30 — End: 2019-07-30
  Administered 2019-07-30: 18:00:00 via ORAL

## 2019-07-30 MED ORDER — KETOROLAC TROMETHAMINE 30 MG/ML INJECTION
30 mg/mL (1 mL) | INTRAMUSCULAR | Status: DC | PRN
Start: 2019-07-30 — End: 2019-07-30
  Administered 2019-07-30: 21:00:00 via INTRAVENOUS

## 2019-07-30 MED ORDER — BUPIVACAINE (PF) 0.25 % (2.5 MG/ML) IJ SOLN
0.25 % (2.5 mg/mL) | INTRAMUSCULAR | Status: DC | PRN
Start: 2019-07-30 — End: 2019-07-30
  Administered 2019-07-30: 21:00:00

## 2019-07-30 MED ORDER — ONDANSETRON (PF) 4 MG/2 ML INJECTION
4 mg/2 mL | INTRAMUSCULAR | Status: AC
Start: 2019-07-30 — End: ?

## 2019-07-30 MED ORDER — ROCURONIUM 10 MG/ML IV
10 mg/mL | INTRAVENOUS | Status: DC | PRN
Start: 2019-07-30 — End: 2019-07-30
  Administered 2019-07-30 (×4): via INTRAVENOUS

## 2019-07-30 MED ORDER — SODIUM CHLORIDE 0.9 % IJ SYRG
INTRAMUSCULAR | Status: DC | PRN
Start: 2019-07-30 — End: 2019-07-30

## 2019-07-30 MED ORDER — ONDANSETRON (PF) 4 MG/2 ML INJECTION
4 mg/2 mL | INTRAMUSCULAR | Status: DC | PRN
Start: 2019-07-30 — End: 2019-07-30
  Administered 2019-07-30 (×2): via INTRAVENOUS

## 2019-07-30 MED ORDER — FENTANYL CITRATE (PF) 50 MCG/ML IJ SOLN
50 mcg/mL | INTRAMUSCULAR | Status: DC | PRN
Start: 2019-07-30 — End: 2019-07-30
  Administered 2019-07-30: 23:00:00 via INTRAVENOUS

## 2019-07-30 MED ORDER — OXYCODONE 5 MG TAB
5 mg | Freq: Once | ORAL | Status: AC | PRN
Start: 2019-07-30 — End: 2019-07-30
  Administered 2019-07-30: 23:00:00 via ORAL

## 2019-07-30 MED ORDER — BUPIVACAINE (PF) 0.25 % (2.5 MG/ML) IJ SOLN
0.25 % (2.5 mg/mL) | INTRAMUSCULAR | Status: DC
Start: 2019-07-30 — End: 2019-07-30

## 2019-07-30 MED ORDER — CEFAZOLIN 2 GM/50 ML IN DEXTROSE (ISO-OSMOTIC) IVPB
2 gram/50 mL | Freq: Once | INTRAVENOUS | Status: AC
Start: 2019-07-30 — End: 2019-07-30
  Administered 2019-07-30: 19:00:00 via INTRAVENOUS

## 2019-07-30 MED ORDER — LACTATED RINGERS IV
INTRAVENOUS | Status: DC
Start: 2019-07-30 — End: 2019-07-30
  Administered 2019-07-30 (×2): via INTRAVENOUS

## 2019-07-30 MED ORDER — MIDAZOLAM 1 MG/ML IJ SOLN
1 mg/mL | INTRAMUSCULAR | Status: DC | PRN
Start: 2019-07-30 — End: 2019-07-30
  Administered 2019-07-30: 19:00:00 via INTRAVENOUS

## 2019-07-30 MED FILL — ONDANSETRON (PF) 4 MG/2 ML INJECTION: 4 mg/2 mL | INTRAMUSCULAR | Qty: 2

## 2019-07-30 MED FILL — FENTANYL CITRATE (PF) 50 MCG/ML IJ SOLN: 50 mcg/mL | INTRAMUSCULAR | Qty: 2

## 2019-07-30 MED FILL — BUPIVACAINE (PF) 0.25 % (2.5 MG/ML) IJ SOLN: 0.25 % (2.5 mg/mL) | INTRAMUSCULAR | Qty: 30

## 2019-07-30 MED FILL — OXYCODONE 5 MG TAB: 5 mg | ORAL | Qty: 1

## 2019-07-30 MED FILL — GABAPENTIN 300 MG CAP: 300 mg | ORAL | Qty: 2

## 2019-07-30 MED FILL — GLUCAGEN DIAGNOSTIC KIT 1 MG/ML INJECTION: 1 mg/mL | INTRAMUSCULAR | Qty: 1

## 2019-07-30 MED FILL — NORMAL SALINE FLUSH 0.9 % INJECTION SYRINGE: INTRAMUSCULAR | Qty: 10

## 2019-07-30 MED FILL — METOCLOPRAMIDE 5 MG/ML IJ SOLN: 5 mg/mL | INTRAMUSCULAR | Qty: 2

## 2019-07-30 MED FILL — DEXAMETHASONE SODIUM PHOSPHATE 4 MG/ML IJ SOLN: 4 mg/mL | INTRAMUSCULAR | Qty: 1

## 2019-07-30 MED FILL — LACTATED RINGERS IV: INTRAVENOUS | Qty: 1000

## 2019-07-30 MED FILL — MIDAZOLAM 1 MG/ML IJ SOLN: 1 mg/mL | INTRAMUSCULAR | Qty: 2

## 2019-07-30 MED FILL — ROCURONIUM 10 MG/ML IV: 10 mg/mL | INTRAVENOUS | Qty: 5

## 2019-07-30 MED FILL — CEFAZOLIN 2 GM/50 ML IN DEXTROSE (ISO-OSMOTIC) IVPB: 2 gram/50 mL | INTRAVENOUS | Qty: 50

## 2019-07-30 MED FILL — LIDOCAINE (PF) 20 MG/ML (2 %) IJ SOLN: 20 mg/mL (2 %) | INTRAMUSCULAR | Qty: 5

## 2019-07-30 MED FILL — TYLENOL EXTRA STRENGTH 500 MG TABLET: 500 mg | ORAL | Qty: 2

## 2019-07-30 MED FILL — KETOROLAC TROMETHAMINE 30 MG/ML INJECTION: 30 mg/mL (1 mL) | INTRAMUSCULAR | Qty: 1

## 2019-07-30 MED FILL — DIPRIVAN 10 MG/ML INTRAVENOUS EMULSION: 10 mg/mL | INTRAVENOUS | Qty: 40

## 2019-07-30 NOTE — Op Note (Signed)
PREOPERATIVE DIAGNOSIS:  Biliary colic.  Symptomatic cholelithiasis.    POSTOPERATIVE DIAGNOSIS:  Same.    PROCEDURE: Laparoscopic cholecystectomy.    SURGEON:  Dr. Misty StanleyStacey Mcdonald Reiling.    ASSISTANT:  None.    ANESTHESIA:  General, as well as local using 0.25% Marcaine plain.    EBL:  20 cc.    IVF:  1200 cc crystalloid.    FINDINGS:  Omental adhesions to the anterior abdominal wall supraumbilically.  Somewhat intrahepatic gallbladder.  Adhesions between the gallbladder and the duodenum.  Intraoperative cholangiogram showed opacification of the common bile duct with normal tapering of the distal common bile duct and no obvious filling defects.  The proximal biliary tree was also visualized.  There was no flow of contrast into the duodenum, even after flushing the duct with saline, administrating 1 mg of IV glucagon to the patient, and repeating the cholangiogram.    SPECIMEN:  Gallbladder.    DRAINS:  None.    COMPLICATIONS:  None.    DISPOSITION:  Stable, extubated to PACU.    DESCRIPTION OF OPERATION:  Fontaine Noancy Parkerson is a 53 y.o.-year-old woman who presents for laparoscopic, possible open cholecystectomy for symptomatic small gallstones/sludge.  Preoperatively I discussed with the patient the risks, benefits, and alternatives to the operation.  We also discussed including intraoperative cholangiogram, in light of the prominent common bile duct measuring 8 mm on right upper quadrant ultrasound, as well as mildly elevated liver function tests.  Procedural risks include but are not limited to bleeding, infection, problems undergoing the anesthetic, injury to local structures such as the bowel or common bile duct, etc.  All questions were answered and the patient signed the informed consent.    The patient was taken to the operating room and placed in the supine position on the operating table. General anesthesia was initiated.  The abdomen was prepped and draped in the standard surgical fashion. After infiltrating with local  anesthetic a small supraumbilical incision was made and carried down through the subcutaneous layer.  Fascia was grasped, elevated, and divided in the midline. Stay sutures of 0 Vicryl were placed.  Peritoneum was grasped, elevated, and entered sharply.  There were some omental adhesions evident to the undersurface of the anterior abdominal wall, in this patient who is status post prior sigmoid colectomy.  The Hasson was inserted and secured.  The abdomen was insufflated with carbon dioxide to a maximum pressure of 15 mm of mercury as the 5 mm 0 degree laparoscope was inserted.   We were unable to obtain a clear view into the right upper quadrant due to adhesions, and it appeared that there was an additional filmy layer of tissue preventing us from fully accessing the peritoneal cavity.  The pneumoperitoneum was evacuated and the laparoscope and Roseanne RenoHassan were removed.  This remaining tissue layer was grasped, elevated, divided sharply.  Blunt dissection was also utilized.  The Roseanne RenoHassan was reinserted and pneumoperitoneum reestablished.  We were able to visualize into the right upper quadrant.  Remaining trocars were then placed under direct vision after infiltrating local anesthetic.  An 11 mm trocar was placed in the epigastrium.  Two 5 mm trocars were placed in the right upper quadrant, one at the midclavicular line and one more laterally beneath the costal margin.  The patient was placed into reverse Trendelenburg position with the right side tilted up.    The fundus of the gallbladder was grasped and elevated.  It was distended and somewhat difficult to grasp, and therefore the  laparoscopic Allis grasper was utilized.  There were adhesions between the duodenum and the undersurface of the gallbladder and these were carefully taken down.  A second grasper was used to give traction on the infundibulum.  The peritoneal attachments were stripped down.  The cystic duct and cystic artery were dissected out.  The critical  view was established.  The cystic artery was doubly clipped and divided.      We then prepared for the intraoperative cholangiogram.  Under direct vision, after infiltrating with local anesthetic, a metal Veress needle was introduced into the peritoneal cavity via a small stab incision located between the epigastric and midclavicular trocar sites.  The inner obturator was removed and the outer cannula used as a mini trocar to introduce a 4 [place] open tip ureteral catheter into the peritoneal cavity for use as the cholangiocatheter.  Two laparoscopic clips were applied to the cystic duct on the specimen side and the cystic duct was opened.  The cholangiocatheter was advanced into the cystic duct and secured.  It flushed easily with injectable saline.  The patient was then placed in neutral position and the cholangiogram was performed utilizing a 1-1 mix of injectable saline and Conray dye.  On the initial images, there was filling of the proximal biliary tree and the majority of the common bile duct, but the distal common duct was not well seen.  After flushing the duct with saline, the cholangiogram was repeated and we could see nice tapering of the distal common duct with no filling defects.  There was no flow of contrast into the duodenum however.  We again flushed the duct with saline, but still could not visualize any contrast flowing into the duodenum.  The patient was administered 1 mg of intravenous glucagon by the anesthesiologist and we waited 5 minutes.  A final cholangiogram was obtained and again no flow into the duodenum.  There was no meniscus sign or filling defect.  It seemed most likely that there was spasm in the sphincter of Oddi rather than mechanical obstruction from a retained stone.  The patient will be managed moving forward on clinical grounds, with plan for repeat liver function tests in the event of any recurrent symptoms or evidence of biliary obstruction in the future.    The patient  was returned to the working position and pneumoperitoneum was re-established.  The cholangiocatheter was removed.  The cystic duct was doubly clipped and divided.  The J-hook electrocautery was then used to mobilize the gallbladder from the liver bed.  The gallbladder was relatively intrahepatic.  It was thin walled and there was some spillage of bile during the dissection, which was then suctioned away.  Once the gallbladder was freed it was placed in an Endo-Catch bag and extracted from the peritoneal cavity via the Hasson site.    The Hasson was reinserted and pneumoperitoneum reestablished.  The laparoscope was reinserted.  The right upper quadrant was irrigated and suctioned.  Good hemostasis was achieved in the gallbladder fossa with the cautery.  There was no evidence of bile leak.  The patient was returned to the neutral position.  Trocars were removed one at a time under direct vision.  The laparoscope and Hasson were removed.    The umbilical incision was closed using figure-of-eight 0 Vicryl for fascia.  Skin for all incisions was closed using 4 0 Monocryl subcuticular. Incisions were cleansed and dried and Steri-Strips were applied followed by sterile gauze dressings.  There were no complications to  the procedure which the patient appeared to tolerate well.  She went stable and extubated to recovery.  All counts were correct at the end of the case.

## 2019-07-30 NOTE — Anesthesia Post-Procedure Evaluation (Signed)
Procedure(s):  CHOLECYSTECTOMY LAPAROSCOPIC WITH INTRAOP CHOLANGIOGRAM.    general    Anesthesia Post Evaluation      Multimodal analgesia: multimodal analgesia used between 6 hours prior to anesthesia start to PACU discharge  Patient location during evaluation: PACU  Patient participation: complete - patient participated  Level of consciousness: awake and alert  Pain management: satisfactory to patient  Airway patency: patent  Anesthetic complications: no  Cardiovascular status: hemodynamically stable  Respiratory status: room air and spontaneous ventilation  Hydration status: euvolemic  Post anesthesia nausea and vomiting:  none  Final Post Anesthesia Temperature Assessment:  Normothermia (36.0-37.5 degrees C)      INITIAL Post-op Vital signs:   Vitals Value Taken Time   BP 112/68 07/30/19 1720   Temp 36.4 ??C (97.5 ??F) 07/30/19 1646   Pulse 82 07/30/19 1720   Resp 11 07/30/19 1720   SpO2 92 % 07/30/19 1720   Vitals shown include unvalidated device data.

## 2019-07-30 NOTE — Anesthesia Pre-Procedure Evaluation (Signed)
Relevant Problems   No relevant active problems       Anesthetic History   No history of anesthetic complications            Review of Systems / Medical History  Patient summary reviewed, nursing notes reviewed and pertinent labs reviewed    Pulmonary  Within defined limits                 Neuro/Psych   Within defined limits           Cardiovascular  Within defined limits                     GI/Hepatic/Renal  Within defined limits              Endo/Other        Arthritis     Other Findings   Comments: Neurogenic bladder  IBS           Physical Exam    Airway  Mallampati: II  TM Distance: 4 - 6 cm  Neck ROM: normal range of motion   Mouth opening: Normal     Cardiovascular  Regular rate and rhythm,  S1 and S2 normal,  no murmur, click, rub, or gallop  Rhythm: regular  Rate: normal         Dental  No notable dental hx       Pulmonary  Breath sounds clear to auscultation               Abdominal  GI exam deferred       Other Findings            Anesthetic Plan    ASA: 2  Anesthesia type: general          Induction: Intravenous  Anesthetic plan and risks discussed with: Patient

## 2019-07-30 NOTE — Anesthesia Post-Procedure Evaluation (Signed)
Procedure(s):  CHOLECYSTECTOMY LAPAROSCOPIC WITH INTRAOP CHOLANGIOGRAM.    general    Anesthesia Post Evaluation      Multimodal analgesia: multimodal analgesia used between 6 hours prior to anesthesia start to PACU discharge  Patient location during evaluation: PACU  Patient participation: complete - patient participated  Level of consciousness: awake and alert  Pain management: satisfactory to patient  Airway patency: patent  Anesthetic complications: no  Cardiovascular status: hemodynamically stable  Respiratory status: room air and spontaneous ventilation  Hydration status: euvolemic  Post anesthesia nausea and vomiting:  none  Final Post Anesthesia Temperature Assessment:  Normothermia (36.0-37.5 degrees C)      INITIAL Post-op Vital signs:   Vitals Value Taken Time   BP 112/68 07/30/19 1720   Temp 36.4 ??C (97.5 ??F) 07/30/19 1646   Pulse 82 07/30/19 1720   Resp 11 07/30/19 1720   SpO2 92 % 07/30/19 1720   Vitals shown include unvalidated device data.

## 2019-07-30 NOTE — Interval H&P Note (Signed)
Update History & Physical    The Patient's History and Physical of July 18, 2019 was reviewed with the patient and I examined the patient.  There was no change.  The surgical site was confirmed by the patient and me.    Plan:  The risk, benefits, expected outcome, and alternative to the recommended procedure have been discussed with the patient.  Patient understands and wants to proceed with the procedure.    Electronically signed by Adora Fridge, MD on 07/30/2019 at 1:36 PM

## 2019-07-30 NOTE — Anesthesia Pre-Procedure Evaluation (Signed)
Relevant Problems   No relevant active problems       Anesthetic History   No history of anesthetic complications            Review of Systems / Medical History  Patient summary reviewed, nursing notes reviewed and pertinent labs reviewed    Pulmonary  Within defined limits                 Neuro/Psych   Within defined limits           Cardiovascular  Within defined limits                     GI/Hepatic/Renal  Within defined limits              Endo/Other        Arthritis     Other Findings   Comments: Neurogenic bladder  IBS           Physical Exam    Airway  Mallampati: II  TM Distance: 4 - 6 cm  Neck ROM: normal range of motion   Mouth opening: Normal     Cardiovascular  Regular rate and rhythm,  S1 and S2 normal,  no murmur, click, rub, or gallop  Rhythm: regular  Rate: normal         Dental  No notable dental hx       Pulmonary  Breath sounds clear to auscultation               Abdominal  GI exam deferred       Other Findings            Anesthetic Plan    ASA: 2  Anesthesia type: general          Induction: Intravenous  Anesthetic plan and risks discussed with: Patient

## 2019-07-30 NOTE — Op Note (Signed)
PREOPERATIVE DIAGNOSIS:  Biliary colic.  Symptomatic cholelithiasis.    POSTOPERATIVE DIAGNOSIS:  Same.    PROCEDURE: Laparoscopic cholecystectomy.    SURGEON:  Dr. Erline Levine Zaniah T[place]ington.    ASSISTANT:  None.    ANESTHESIA:  General, as well as local using 0.25% Marcaine plain.    EBL:  20 cc.    IVF:  1200 cc crystalloid.    FINDINGS:  Omental adhesions to the anterior abdominal wall supraumbilically.  Somewhat intrahepatic gallbladder.  Adhesions between the gallbladder and the duodenum.  Intraoperative cholangiogram showed opacification of the common bile duct with normal tapering of the distal common bile duct and no obvious filling defects.  The proximal biliary tree was also visualized.  There was no flow of contrast into the duodenum, even after flushing the duct with saline, administrating 1 mg of IV glucagon to the patient, and repeating the cholangiogram.    SPECIMEN:  Gallbladder.    DRAINS:  None.    COMPLICATIONS:  None.    DISPOSITION:  Stable, extubated to PACU.    DESCRIPTION OF OPERATION:  Debra Oconnell is a 53 y.o.-year-old woman who presents for laparoscopic, possible open cholecystectomy for symptomatic small gallstones/sludge.  Preoperatively I discussed with the patient the risks, benefits, and alternatives to the operation.  We also discussed including intraoperative cholangiogram, in light of the prominent common bile duct measuring 8 mm on right upper quadrant ultrasound, as well as mildly elevated liver function tests.  Procedural risks include but are not limited to bleeding, infection, problems undergoing the anesthetic, injury to local structures such as the bowel or common bile duct, etc.  All questions were answered and the patient signed the informed consent.     The patient was taken to the operating room and placed in the supine position on the operating table. General anesthesia was initiated.  The abdomen was prepped and draped in the standard surgical fashion. After infiltrating with local anesthetic a small supraumbilical incision was made and carried down through the subcutaneous layer.  Fascia was grasped, elevated, and divided in the midline. Stay sutures of 0 Vicryl were placed.  Peritoneum was grasped, elevated, and entered sharply.  There were some omental adhesions evident to the undersurface of the anterior abdominal wall, in this patient who is status post prior sigmoid colectomy.  The Hasson was inserted and secured.  The abdomen was insufflated with carbon dioxide to a maximum pressure of 15 mm of mercury as the 5 mm 0 degree laparoscope was inserted.   We were unable to obtain a clear view into the right upper quadrant due to adhesions, and it appeared that there was an additional filmy layer of tissue preventing [place] from fully accessing the peritoneal cavity.  The pneumoperitoneum was evacuated and the laparoscope and Sheryle Hail were removed.  This remaining tissue layer was grasped, elevated, divided sharply.  Blunt dissection was also utilized.  The Sheryle Hail was reinserted and pneumoperitoneum reestablished.  We were able to visualize into the right upper quadrant.  Remaining trocars were then placed under direct vision after infiltrating local anesthetic.  An 11 mm trocar was placed in the epigastrium.  Two 5 mm trocars were placed in the right upper quadrant, one at the midclavicular line and one more laterally beneath the costal margin.  The patient was placed into reverse Trendelenburg position with the right side tilted up.     The fundus of the gallbladder was grasped and elevated.  It was distended and somewhat difficult to grasp, and therefore  the laparoscopic Allis grasper was utilized.  There were adhesions between the duodenum and the undersurface of the gallbladder and these were carefully taken down.  A second grasper was used to give traction on the infundibulum.  The peritoneal attachments were stripped down.  The cystic duct and cystic artery were dissected out.  The critical view was established.  The cystic artery was doubly clipped and divided.       We then prepared for the intraoperative cholangiogram.  Under direct vision, after infiltrating with local anesthetic, a metal Veress needle was introduced into the peritoneal cavity via a small stab incision located between the epigastric and midclavicular trocar sites.  The inner obturator was removed and the outer cannula used as a mini trocar to introduce a 4 [place] open tip ureteral catheter into the peritoneal cavity for use as the cholangiocatheter.  Two laparoscopic clips were applied to the cystic duct on the specimen side and the cystic duct was opened.  The cholangiocatheter was advanced into the cystic duct and secured.  It flushed easily with injectable saline.  The patient was then placed in neutral position and the cholangiogram was performed utilizing a 1-1 mix of injectable saline and Conray dye.  On the initial images, there was filling of the proximal biliary tree and the majority of the common bile duct, but the distal common duct was not well seen.  After flushing the duct with saline, the cholangiogram was repeated and we could see nice tapering of the distal common duct with no filling defects.  There was no flow of contrast into the duodenum however.  We again flushed the duct with saline, but still could not visualize any contrast flowing into the duodenum.  The patient was administered 1 mg of intravenous glucagon by the anesthesiologist and we waited 5 minutes.  A final cholangiogram was obtained and again no flow into the duodenum.  There was no meniscus sign or filling defect.  It seemed most likely that there was spasm in the sphincter of Oddi rather than mechanical obstruction from a retained stone.  The patient will be managed moving forward on clinical grounds, with plan for repeat liver function tests in the event of any recurrent symptoms or evidence of biliary obstruction in the future.     The patient was returned to the working position and pneumoperitoneum was re-established.  The cholangiocatheter was removed.  The cystic duct was doubly clipped and divided.  The J-hook electrocautery was then used to mobilize the gallbladder from the liver bed.  The gallbladder was relatively intrahepatic.  It was thin walled and there was some spillage of bile during the dissection, which was then suctioned away.  Once the gallbladder was freed it was placed in an Endo-Catch bag and extracted from the peritoneal cavity via the Hasson site.    The Hasson was reinserted and pneumoperitoneum reestablished.  The laparoscope was reinserted.  The right upper quadrant was irrigated and suctioned.  Good hemostasis was achieved in the gallbladder fossa with the cautery.  There was no evidence of bile leak.  The patient was returned to the neutral position.  Trocars were removed one at a time under direct vision.  The laparoscope and Hasson were removed.    The umbilical incision was closed using figure-of-eight 0 Vicryl for fascia.  Skin for all incisions was closed using 4 0 Monocryl subcuticular. Incisions were cleansed and dried and Steri-Strips were applied followed by sterile gauze dressings.  There were no complications  to the procedure which the patient appeared to tolerate well.  She went stable and extubated to recovery.  All counts were correct at the end of the case.

## 2021-11-01 ENCOUNTER — Ambulatory Visit (INDEPENDENT_AMBULATORY_CARE_PROVIDER_SITE_OTHER): Payer: 59

## 2021-11-01 ENCOUNTER — Other Ambulatory Visit: Payer: Self-pay

## 2021-11-01 ENCOUNTER — Ambulatory Visit (INDEPENDENT_AMBULATORY_CARE_PROVIDER_SITE_OTHER): Payer: 59 | Admitting: Family Medicine

## 2021-11-01 VITALS — BP 106/72 | HR 62 | Wt 193.6 lb

## 2021-11-01 DIAGNOSIS — M79642 Pain in left hand: Secondary | ICD-10-CM | POA: Diagnosis not present

## 2021-11-01 DIAGNOSIS — S52532A Colles' fracture of left radius, initial encounter for closed fracture: Secondary | ICD-10-CM | POA: Diagnosis not present

## 2021-11-01 MED ORDER — CELECOXIB 200 MG PO CAPS: ORAL_CAPSULE | ORAL | 2 refills | Status: DC

## 2021-11-01 NOTE — Patient Instructions (Addendum)
Thank you for coming in today.  ° °Please get an Xray today before you leave  ° °Recheck back in 2 weeks. °

## 2021-11-01 NOTE — Progress Notes (Signed)
? ?I, Philbert Riser, LAT, ATC acting as a scribe for Clementeen Graham, MD. ? ?Subjective:   ? ?CC: L wrist pain  ? ?HPI: Pt is a 56 y/o female c/o L wrist pain due to a transverse fx of the distal radial fx w/ dorsal impaction ongoing since Saturday, 3/25, after suffering a fall. Pt was outside working, slipped on the mud, then tripped over a stake and fell to the L side, FOOSHed onto a rock. Pt is R-had dominate. Pt was seen after the injury at the [facility] ED where the fx was reduce and she was placed in a sugar tong splint. Today, pt reports the L wrist is very painful. Pt locates pain to distal forearm, proximally into the L elbow, TTP into her fingers. Pt notes increase pain when trying to move her fingers. Pt lives in a camper and doesn't have access to ice. ? ?She lives in [address] of [place]. ? ?L wrist swelling: yes, and fingers ?Treatment: oxycodone, Tylenol, sling, ? ?Pertinent review of Systems: No fevers or chills ? ?Relevant historical information: History of diverticulitis ? ? ?Objective:   ? ?Vitals:  ? 11/01/21 1503  ?BP: 106/72  ?Pulse: 62  ?SpO2: 96%  ? ?General: Well Developed, well nourished, and in no acute distress.  ? ?MSK: Left hand swollen.  Tender palpation fifth digit.  Sensation capillary fill is intact distally.  Wrist motion was not tested elbow motion was not tested. ?The sugar-tong splint was left on. ? ?Lab and Radiology Results ? ?The images of the left wrist pre and postreduction available in PACS were personally independently interpreted. ? ?Comminuted transverse fracture distal radius with volar angulation with good reduction in sugar-tong splint. ? ?X-ray images left hand obtained today personally and independently interpreted ?No clear fracture of fifth digit. ?Await formal radiology review ? ?Impression and Recommendations:   ? ?Assessment and Plan: ?56 y.o. female with left distal radius fracture status postreduction 2 days ago.  Patient has a  good reduction and has a well-formed sugar-tong splint.  I did not remove the splint today as it is doing its job well and I think it would cause more harm than good.  Plan to continue the splint and recheck in 2 weeks.  We will try Celebrex for pain control in addition to her oxycodone for now.  I expect that I probably will have to refill the oxycodone later this week or early next week.  Although I do recommend that she try to wean off the oxycodone and also recommend using a laxative. ? ?She has some additional finger pain today.  X-ray of her hand today did not show an obvious fracture of the fifth digit although radiology overread is still pending. ? ?Recheck 2 weeks.  Expect to remove the splint at that time and likely fit her with a long-arm cast, or a Exos short arm cast if doing well with pronation supination.. ? ?PDMP not reviewed this encounter. ?Orders Placed This Encounter  ?Procedures  ? DG Hand Complete Left  ?  Standing Status:   Future  ?  Number of Occurrences:   1  ?  Standing Expiration Date:   11/02/2022  ?  Order Specific Question:   Reason for Exam (SYMPTOM  OR DIAGNOSIS REQUIRED)  ?  Answer:   eval 5th digit in the cast  ?  Order Specific Question:   Is patient pregnant?  ?  Answer:   No  ?  Order Specific Question:  Preferred imaging location?  ?  Answer:   [facility]  ? ?Meds ordered this encounter  ?Medications  ? celecoxib (CELEBREX) 200 MG capsule  ?  Sig: One to 2 tablets by mouth daily as needed for pain.  ?  Dispense:  60 capsule  ?  Refill:  2  ? ? ?Discussed warning signs or symptoms. Please see discharge instructions. Patient expresses understanding. ? ? ?The above documentation has been reviewed and is accurate and complete Clementeen Graham, M.D. ? ?

## 2021-11-02 ENCOUNTER — Telehealth: Payer: Self-pay | Admitting: Family Medicine

## 2021-11-02 ENCOUNTER — Encounter: Payer: Self-pay | Admitting: Family Medicine

## 2021-11-02 NOTE — Telephone Encounter (Signed)
Pt has a work note from [employer] that has her out til March 31. ? 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 ? ?Pt not sure she can go back to work anytime soon, definitely not on March 31. ? ?Needs an updated note from Dr. Denyse Amass and wondering when he thinks she will be able to return. ?

## 2021-11-02 NOTE — Telephone Encounter (Signed)
I have written a work note but you out for 3 months (June 28).  You will likely get better before then but 3 months gives [place] plenty of time to get you better and not have to constantly renew your work note or paperwork. ?Do want me to fax this to your employer or mail it to you? ? ?Please let me know how you want me to get this work note to you. ?

## 2021-11-02 NOTE — Telephone Encounter (Signed)
Called pt and LM for her to return call and provide [place] w/ additional info as to where her updated work note needs to be sent.  Please get info regarding where her work note needs to be sent and send to appropriate person/location. ?

## 2021-11-03 NOTE — Telephone Encounter (Signed)
Per pt, emailed [facility] note to her at ?[email or web address] ?

## 2021-11-03 NOTE — Progress Notes (Signed)
Left hand x-ray shows a distal radius fracture as we already knew about.  No fracture at the fifth finger.

## 2021-11-10 ENCOUNTER — Telehealth: Payer: Self-pay

## 2021-11-10 MED ORDER — OXYCODONE-ACETAMINOPHEN 5-325 MG PO TABS: 1.0000 | ORAL_TABLET | Freq: Three times a day (TID) | ORAL | 0 refills | Status: DC | PRN

## 2021-11-10 NOTE — Addendum Note (Signed)
Addended by: Rodolph Bong on: 11/10/2021 10:37 AM ? ? Modules accepted: Orders ? ?

## 2021-11-10 NOTE — Progress Notes (Signed)
? ?I, Christoper Fabian, LAT, ATC, am serving as scribe for Dr. Clementeen Graham. ? ?Mary Rogers is a 56 y.o. female who presents to [facility] at [facility] today for f/u of L wrist pain due to a transverse fx of the distal radial fx w/ dorsal impaction that occurred on Saturday, 10/30/21, after suffering a fall, landing on an outstretched L hand.  Pt was seen after the injury at the [facility] ED where the fx was reduce and she was placed in a sugar tong splint.  She was last seen by Dr. Denyse Amass on 11/01/21 and was prescribed Celebrex to take in combo w/ oxycodone for pain.  She was also advised to con't w/ the sugar tong splint from the ED.  Today, pt reports she has been wearing the splint and sling. Pt has cont pain through the L wrist. Pt has only take 2 of the oxy she was prescribed and has been using Tylenol during the day. ? 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  She thinks that she could do that job and would like to have a note allowing her to transition to that job. ? ?Diagnostic testing: L hand XR- 11/01/21; L wrist XR at [facility] ED- 10/30/21 ? ?Pertinent review of systems: No fevers or chills ? ?Relevant historical information: Otherwise healthy ? ? ?Exam:  ?BP 112/74   Pulse 83   Ht 5\' 4"  (1.626 m)   Wt 190 lb 3.2 oz (86.3 kg)   SpO2 96%   BMI 32.65 kg/m?  ?General: Well Developed, well nourished, and in no acute distress.  ? ?MSK: Left wrist bruising present at the volar wrist ?Hand at the MCP and distal is swollen.  Slight decreased range of motion of the MCPs and fingers.  Wrist range of motion not tested. ?Tender palpation at the dorsal and right volar wrist ?Pain with wrist pronation supination present. ? ? ? ? ?Lab and Radiology Results ? ?X-ray images left wrist obtained today personally and independently interpreted. ?Transverse fracture across the distal radius present with mild  ulnar displacement and angulation.  Fracture angulation or displacement is stable from postreduction films.  No callus formation is present yet although periosteal reabsorption is evident. ?Await formal radiology review ? ? ? ? ?Assessment and Plan: ?56 y.o. female with left wrist fracture occurring on March 25.  The fracture was reduced in the emergency room as patient was placed into a sugar-tong splint.  She has done well over the last 2 weeks.  Plan to continue immobilization of the wrist and elbow for another week or 2.  I discussed options and offered her a long-arm cast continued to her splint.  The splint is in good shape and is still working quite well.  I think it be simpler for her just to use the splint for another 2 weeks and transition at that point to a short arm cast.  I anticipate that in 2 weeks she will probably tolerate supination and pronation pretty well and we can get her into an Exos cast. ?She was comfortable with reapplication of the sugar-tong splint. ? ?Additionally discussed return to work.  There is a light duty option for her which should be acceptable.  Work note written. ? ?Recheck in 2 weeks. ? ? ?PDMP not reviewed this encounter. ?Orders Placed This Encounter  ?Procedures  ? DG  Wrist Complete Left  ?  Standing Status:   Future  ?  Number of Occurrences:   1  ?  Standing Expiration Date:   12/16/2021  ?  Order Specific Question:   Reason for Exam (SYMPTOM  OR DIAGNOSIS REQUIRED)  ?  Answer:   L wrist fx  ?  Order Specific Question:   Is patient pregnant?  ?  Answer:   No  ?  Order Specific Question:   Preferred imaging location?  ?  Answer:   [facility]  ? ?No orders of the defined types were placed in this encounter. ? ? ? ?Discussed warning signs or symptoms. Please see discharge instructions. Patient expresses understanding. ? ? ?The above documentation has been reviewed and is accurate and complete Clementeen Graham, M.D. ? ? ?

## 2021-11-10 NOTE — Telephone Encounter (Signed)
Pt called reporting pain waking her up several times at night and radiating up into her elbow and upperarm. She has been taking the Celebrex, and spacing it out to try to get through the night, but is still struggling. No re-injury and has been compliant in wearing the splint.  ? ?The ED only gave her 1-2 days of oxycodone and she has been out. She was wondering if you could refill the oxy or prescribe something different for pain. ?

## 2021-11-10 NOTE — Telephone Encounter (Signed)
I have prescribed oxycodone 5 mg to the [facility] pharmacy. ?

## 2021-11-15 ENCOUNTER — Ambulatory Visit: Payer: 59 | Admitting: Family Medicine

## 2021-11-16 ENCOUNTER — Ambulatory Visit (INDEPENDENT_AMBULATORY_CARE_PROVIDER_SITE_OTHER): Payer: 59

## 2021-11-16 ENCOUNTER — Ambulatory Visit (INDEPENDENT_AMBULATORY_CARE_PROVIDER_SITE_OTHER): Payer: 59 | Admitting: Family Medicine

## 2021-11-16 VITALS — BP 112/74 | HR 83 | Ht 64.0 in | Wt 190.2 lb

## 2021-11-16 DIAGNOSIS — S52532D Colles' fracture of left radius, subsequent encounter for closed fracture with routine healing: Secondary | ICD-10-CM

## 2021-11-16 DIAGNOSIS — S52532A Colles' fracture of left radius, initial encounter for closed fracture: Secondary | ICD-10-CM | POA: Insufficient documentation

## 2021-11-16 NOTE — Patient Instructions (Addendum)
Thank you for coming in today.  ? ?Continue wearing splint. ? ?Recheck back in 2 weeks ?

## 2021-11-17 NOTE — Progress Notes (Signed)
The wrist fracture looks stable on x-ray.  This is great news.

## 2021-11-19 ENCOUNTER — Telehealth: Payer: Self-pay | Admitting: Family Medicine

## 2021-11-19 ENCOUNTER — Telehealth: Payer: Self-pay

## 2021-11-19 ENCOUNTER — Ambulatory Visit (INDEPENDENT_AMBULATORY_CARE_PROVIDER_SITE_OTHER): Payer: 59

## 2021-11-19 ENCOUNTER — Ambulatory Visit (INDEPENDENT_AMBULATORY_CARE_PROVIDER_SITE_OTHER): Payer: 59 | Admitting: Sports Medicine

## 2021-11-19 VITALS — BP 122/80 | HR 72 | Ht 64.0 in | Wt 190.0 lb

## 2021-11-19 DIAGNOSIS — M25532 Pain in left wrist: Secondary | ICD-10-CM

## 2021-11-19 DIAGNOSIS — S52532D Colles' fracture of left radius, subsequent encounter for closed fracture with routine healing: Secondary | ICD-10-CM

## 2021-11-19 DIAGNOSIS — M25539 Pain in unspecified wrist: Secondary | ICD-10-CM

## 2021-11-19 NOTE — Telephone Encounter (Signed)
Left pt a VM asking for her to call us back so we can see what's going on w/ her injury. ?

## 2021-11-19 NOTE — Patient Instructions (Signed)
Good to see you   

## 2021-11-19 NOTE — Progress Notes (Signed)
? ?   Mary Rogers D.Judd Gaudier ?[facility] ?[address] [place] [address] ?Phone: [phone] ?  ?Assessment and Plan:   ?  ?1. Left wrist pain ?2. Closed Colles' fracture of left radius with routine healing, subsequent encounter ?-Acute with exacerbation, subsequent sports medicine visit ?- No change in left-sided Colles' fracture.  Sugar-tong splint was rewrapped for patient's comfort, including cushion material underneath sugar-tong ?- X-ray repeated in clinic.  My interpretation: Stable transverse fracture of distal radius, unchanged from previous x-ray ?  ?Pertinent previous records reviewed include none ?  ?Follow Up: Continue 2-week follow-up previously scheduled with Dr. Gracelyn Nurse ?  ?Subjective:   ? ?Chief Complaint: Worsening left wrist pain ? ?HPI:  ?11/16/2021 ?Mary Rogers is a 56 y.o. female who presents to [facility] at [facility] today for f/u of L wrist pain due to a transverse fx of the distal radial fx w/ dorsal impaction that occurred on Saturday, 10/30/21, after suffering a fall, landing on an outstretched L hand.  Pt was seen after the injury at the [facility] ED where the fx was reduce and she was placed in a sugar tong splint.  She was last seen by Dr. Denyse Amass on 11/01/21 and was prescribed Celebrex to take in combo w/ oxycodone for pain.  She was also advised to con't w/ the sugar tong splint from the ED.  Today, pt reports she has been wearing the splint and sling. Pt has cont pain through the L wrist. Pt has only take 2 of the oxy she was prescribed and has been using Tylenol during the day. ?  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  She thinks that she could do that job and would like to have a note allowing her to transition to that job. ?  ?Diagnostic testing: L hand XR- 11/01/21; L wrist XR at [facility] ED- 10/30/21 ?  ?11/19/2021 ?Patient states  that he dog jumped up and hit her arm yesterday and its still hurting really bad and doesn't want it to be displaced, has a crease in her splint and its hurting the splint doesn't feel as stable radial side is really sore isn't loving the splint with out the padding that she had dr Denyse Amass take out  ? ?Additional pertinent review of systems negative. ? ? ?Current Outpatient Medications:  ?  celecoxib (CELEBREX) 200 MG capsule, One to 2 tablets by mouth daily as needed for pain., Disp: 60 capsule, Rfl: 2 ?  oxyCODONE-acetaminophen (PERCOCET/ROXICET) 5-325 MG tablet, Take 1 tablet by mouth every 8 (eight) hours as needed for severe pain., Disp: 20 tablet, Rfl: 0  ? ?Objective:   ?  ?Vitals:  ? 11/19/21 1535  ?BP: 122/80  ?Pulse: 72  ?SpO2: 97%  ?Weight: 190 lb (86.2 kg)  ?Height: 5\' 4"  (1.626 m)  ?  ?  ?Body mass index is 32.61 kg/m?.  ?  ?Physical Exam:   ? ?General: Appears well, nad, nontoxic and pleasant ?Neuro:sensation intact, ?Skin: Mild discoloration over wrist without skin breakdown ? ?Left wrist:  No deformity with mild swelling appreciated. ?  ? ? ?Electronically signed by:  ? D.Mary Rogers ?[facility] ?4:12 PM 11/19/21 ?

## 2021-11-19 NOTE — Telephone Encounter (Signed)
Pt returned VM and reported her dog ran into her L wrist/forearm yesterday which caused an increase in her pain and her splint isn't fitting comfortably. Pt was scheduled to be re-evaluated by Dr. Glennon Mac later this afternoon. ?

## 2021-11-19 NOTE — Telephone Encounter (Signed)
Patient is coming in to see Dr Glennon Mac today. ?

## 2021-11-19 NOTE — Telephone Encounter (Signed)
Patient called stating that her dog bumped into her arm and it has been hurting ever since then. She is worried that it may have moved around in the splint. ? ?Would you be able to call her to discuss what to do? ? ?

## 2021-11-26 ENCOUNTER — Ambulatory Visit (INDEPENDENT_AMBULATORY_CARE_PROVIDER_SITE_OTHER): Payer: 59 | Admitting: Family Medicine

## 2021-11-26 ENCOUNTER — Ambulatory Visit (INDEPENDENT_AMBULATORY_CARE_PROVIDER_SITE_OTHER): Payer: 59

## 2021-11-26 VITALS — BP 106/80 | HR 65 | Ht 64.0 in | Wt 195.2 lb

## 2021-11-26 DIAGNOSIS — S52532D Colles' fracture of left radius, subsequent encounter for closed fracture with routine healing: Secondary | ICD-10-CM | POA: Diagnosis not present

## 2021-11-26 NOTE — Progress Notes (Signed)
? ?  I, Christoper Fabian, LAT, ATC, am serving as scribe for Dr. Clementeen Graham. ? ?Mary Rogers is a 56 y.o. female who presents to [facility] at [facility] today for f/u of L wrist pain due to a transverse fx of the distal radial fx w/ dorsal impaction that occurred on Saturday, 10/30/21, after suffering a fall, landing on an outstretched L hand.  She was last seen by Dr. Jean Rosenthal on 11/19/21 after her dog jumped up and hit her arm the day before.  Today, pt reports that she jumped into the truck and "wailed it" but is unsure of exactly what she did.  Her L wrist pain has increased and her L hand swelling has increased. ? ?Diagnostic testing: L wrist XR- 11/19/21, 11/16/21, 11/01/21 ? ?Pertinent review of systems: No fevers or chills ? ?Relevant historical information: Otherwise healthy ? ? ?Exam:  ?BP 106/80 (BP Location: Right Arm, Patient Position: Sitting, Cuff Size: Normal)   Pulse 65   Ht 5\' 4"  (1.626 m)   Wt 195 lb 3.2 oz (88.5 kg)   SpO2 96%   BMI 33.51 kg/m?  ?General: Well Developed, well nourished, and in no acute distress.  ? ?MSK: Left hand and wrist: Swelling at and distal to the MCPs. ?Tender palpation at radial wrist. ?Wrist motion not tested. ?Elbow motion limited extension with some stiffness and pain.  Some wrist pain with supination present. ? ?Patient felt reasonably comfortable in a short arm Exos cast ? ? ? ?Lab and Radiology Results ? ?X-ray images left wrist obtained today personally and independently interpreted ?Transverse minimally displaced fracture at left distal radius.  No significant change in angulation or displacement from prior x-rays. ?No significant callus formation present yet. ?Await formal radiology review ? ? ? ?Assessment and Plan: ?56 y.o. female with left distal radius fracture.  Its been about 4 weeks now and she still having a fair amount of pain despite immobilization with a long-arm splint.  At this point we will try transitioning to a short arm cast.  We  use Exos short arm cast.  Plan on recheck in 2 weeks.  We did talk about a possible hand surgery consultation.  She is struggling and may benefit from ORIF immobilization. ?If she still has trouble that may be worthwhile surgical consultation. ? ? ?PDMP not reviewed this encounter. ?Orders Placed This Encounter  ?Procedures  ? DG Wrist Complete Left  ?  Standing Status:   Future  ?  Number of Occurrences:   1  ?  Standing Expiration Date:   12/26/2021  ?  Order Specific Question:   Reason for Exam (SYMPTOM  OR DIAGNOSIS REQUIRED)  ?  Answer:   L wrist pain  ?  Order Specific Question:   Is patient pregnant?  ?  Answer:   No  ?  Order Specific Question:   Preferred imaging location?  ?  Answer:   12/28/2021  ? ?No orders of the defined types were placed in this encounter. ? ? ? ?Discussed warning signs or symptoms. Please see discharge instructions. Patient expresses understanding. ? ? ?The above documentation has been reviewed and is accurate and complete Kyra Searles, M.D. ? ? ?

## 2021-11-26 NOTE — Patient Instructions (Addendum)
Good to see you today. ? ?Follow-up: 2 weeks ?

## 2021-11-29 NOTE — Progress Notes (Signed)
Wrist x-ray is unchanged appearing

## 2021-11-30 ENCOUNTER — Ambulatory Visit: Payer: 59 | Admitting: Family Medicine

## 2021-12-01 ENCOUNTER — Telehealth: Payer: Self-pay | Admitting: Family Medicine

## 2021-12-01 DIAGNOSIS — S52532D Colles' fracture of left radius, subsequent encounter for closed fracture with routine healing: Secondary | ICD-10-CM

## 2021-12-01 NOTE — Telephone Encounter (Signed)
Pt wrist not improving. Still swollen, hurts when turning, icing does not help. ? ?Pt would like to be referred to an Ortho surgeon, if Dr. Georgina Snell is in agreement. ?

## 2021-12-01 NOTE — Telephone Encounter (Signed)
Referred to Dr. Tempie Donning at [facility]. ?

## 2021-12-01 NOTE — Telephone Encounter (Signed)
Pt called and informed referral has been placed. Pt decided to keep her f/u visit with Dr. Denyse Amass and will call to cancel if surgery is warranted. ?

## 2021-12-03 ENCOUNTER — Ambulatory Visit (INDEPENDENT_AMBULATORY_CARE_PROVIDER_SITE_OTHER): Payer: 59 | Admitting: Orthopedic Surgery

## 2021-12-03 ENCOUNTER — Ambulatory Visit (INDEPENDENT_AMBULATORY_CARE_PROVIDER_SITE_OTHER): Payer: 59

## 2021-12-03 DIAGNOSIS — S52532D Colles' fracture of left radius, subsequent encounter for closed fracture with routine healing: Secondary | ICD-10-CM | POA: Diagnosis not present

## 2021-12-06 NOTE — Progress Notes (Signed)
? ?Office Visit Note ?  ?Patient: Mary Rogers           ?Date of Birth: 1966/04/09           ?MRN: [id number] ?Visit Date: 12/03/2021 ?             ?Requested by: Rodolph Bong, MD ?[address] ?[place],  [place] [address] ?PCP: Pcp, No ? ? ?Assessment & Plan: ?Visit Diagnoses:  ?1. Closed Colles' fracture of left radius with routine healing, subsequent encounter   ? ? ?Plan: Reviewed today's x-rays with patient which show largely unchanged alignment from the first x-rays in the system from 3/29. She is now approximately 5 weeks out from injury.  Discussed with patient that I would continue to treat this nonoperatively given the age of the fracture and unchanged alignment.  Her pain and swelling have improved since the injury.  We discussed the nature of fracture healing.  She has started a vitamin D and calcium supplement.  She can either follow up with me again in 2-3 weeks or can follow up with Dr. Denyse Amass.   ? ?Follow-Up Instructions: No follow-ups on file.  ? ?Orders:  ?Orders Placed This Encounter  ?Procedures  ? XR Wrist Complete Left  ? ?No orders of the defined types were placed in this encounter. ? ? ? ? Procedures: ?No procedures performed ? ? ?Clinical Data: ?No additional findings. ? ? ?Subjective: ?Chief Complaint  ?Patient presents with  ? Left Wrist - Pain  ? ? ?This is a 56 year old right-hand-dominant female who presents with a left distal radius fracture.  She is working outside and slipped on some mud on 10/30/2021 and landed on her left wrist.  She was seen in the ER where the fracture was reduced and she was placed in a sugar tong splint.  She has since been transition to an Exos brace which she finds quite comfortable.  It seems to fit well.  Her pain has improved since the injury.  She has much improved finer ROM.  She says she has 4-5/10 pain at worst.  She denies any numbness or paresthesias in her fingers.  Her swelling is improved since the injury. ? ? ?Review of  Systems ? ? ?Objective: ?Vital Signs: There were no vitals taken for this visit. ? ?Physical Exam ?Constitutional:   ?   Appearance: Normal appearance.  ?Cardiovascular:  ?   Rate and Rhythm: Normal rate.  ?   Pulses: Normal pulses.  ?Pulmonary:  ?   Effort: Pulmonary effort is normal.  ?Skin: ?   General: Skin is warm and dry.  ?   Capillary Refill: Capillary refill takes less than 2 seconds.  ?Neurological:  ?   Mental Status: She is alert.  ? ? ?Left Hand Exam  ? ?Tenderness  ?Left hand tenderness location: TTP across dorsum of distal radius w/ moderate swelling.  ? ?Other  ?Erythema: absent ?Sensation: normal ?Pulse: present ? ?Comments:  No pain w/ gentle PROM of wrist.  Lacks 1-2 cm finger flexion from [facility]. Moderate wrist swelling.  SILT throughout hand.  ? ? ? ? ?Specialty Comments:  ?No specialty comments available. ? ?Imaging: ?No results found. ? ? ?PMFS History: ?Patient Active Problem List  ? Diagnosis Date Noted  ? Fracture, Colles, left, closed 11/16/2021  ? ?No past medical history on file.  ?No family history on file.  ? ?Social History  ? ?Occupational History  ? Not on file  ?Tobacco Use  ? Smoking status:  Not on file  ? Smokeless tobacco: Not on file  ?Substance and Sexual Activity  ? Alcohol use: Not on file  ? Drug use: Not on file  ? Sexual activity: Not on file  ? ? ? ? ? ? ?

## 2021-12-08 NOTE — Progress Notes (Signed)
? ?  I, Christoper Fabian, LAT, ATC, am serving as scribe for Dr. Clementeen Graham. ? ?Mary Rogers is a 56 y.o. female who presents to [facility] at [facility] today for f/u of L wrist pain due to a transverse fx of the distal radial fx w/ dorsal impaction that occurred on Saturday, 10/30/21, after suffering a fall, landing on an outstretched L hand. She was last seen by Dr. Denyse Amass on 11/26/21 after hitting her L hand/wrist on her truck and reported increased L wrist pain and swelling.  She then saw Dr.  ?Benfield for a 2nd opinion and he advised to con't treating her wrist fx conservatively.  Today, pt reports that her L wrist is doing ok.  She states that she has to tighten and loosen her Exos splint throughout the day due to circulation issues. ? ?Diagnostic testing: L wrist XR- 12/03/21, 11/19/21, 11/16/21, 11/01/21 ? ?Pertinent review of systems: no fever or chills ? ?Relevant historical information: Otherwise healthy ? ? ?Exam:  ?BP 120/80 (BP Location: Right Arm, Patient Position: Sitting, Cuff Size: Normal)   Pulse 63   Ht 5\' 4"  (1.626 m)   Wt 195 lb 3.2 oz (88.5 kg)   SpO2 97%   BMI 33.51 kg/m?  ?General: Well Developed, well nourished, and in no acute distress.  ? ?MSK: Left wrist still swollen at hands And distal to MCPs. ?Mildly tender palpation at dorsal radius. ?Wrist range of motion not tested. ? ? ? ?Lab and Radiology Results ? ?X-ray images left wrist obtained on April 28 personally and independently interpreted ?Stable appearing fracture with no change in angulation or displacement.  No significant callus formation present. ? ? ? ?Assessment and Plan: ?56 y.o. female with left wrist fracture.  Ongoing for about 5 weeks.  Doing pretty well in a short arm Exos cast.  Plan to continue immobilization and recheck in 2 to 3 weeks. ? ?We will also obtain DEXA scan to check bone density given postmenopausal status and fracture.  Continue calcium and vitamin D. ? ?Recheck in 3 weeks. ? ? ?PDMP not  reviewed this encounter. ?Orders Placed This Encounter  ?Procedures  ? DG BONE DENSITY (DXA)  ?  Standing Status:   Future  ?  Standing Expiration Date:   12/10/2022  ?  Order Specific Question:   Reason for Exam (SYMPTOM  OR DIAGNOSIS REQUIRED)  ?  Answer:   eval bone density  ?  Order Specific Question:   Is the patient pregnant?  ?  Answer:   No  ?  Order Specific Question:   Preferred imaging location?  ?  Answer:   02/09/2023  ? DG Wrist Complete Left  ?  Standing Status:   Future  ?  Number of Occurrences:   1  ?  Standing Expiration Date:   12/10/2022  ?  Order Specific Question:   Reason for Exam (SYMPTOM  OR DIAGNOSIS REQUIRED)  ?  Answer:   eval left wrist  ?  Order Specific Question:   Is patient pregnant?  ?  Answer:   No  ?  Order Specific Question:   Preferred imaging location?  ?  Answer:   02/09/2023  ? ?No orders of the defined types were placed in this encounter. ? ? ? ?Discussed warning signs or symptoms. Please see discharge instructions. Patient expresses understanding. ? ? ?The above documentation has been reviewed and is accurate and complete Wyn Quaker, M.D. ? ? ?

## 2021-12-09 ENCOUNTER — Ambulatory Visit: Payer: 59

## 2021-12-09 ENCOUNTER — Ambulatory Visit (INDEPENDENT_AMBULATORY_CARE_PROVIDER_SITE_OTHER): Payer: 59 | Admitting: Family Medicine

## 2021-12-09 ENCOUNTER — Other Ambulatory Visit: Payer: Self-pay | Admitting: Radiology

## 2021-12-09 VITALS — BP 120/80 | HR 63 | Ht 64.0 in | Wt 195.2 lb

## 2021-12-09 DIAGNOSIS — Z780 Asymptomatic menopausal state: Secondary | ICD-10-CM

## 2021-12-09 DIAGNOSIS — S52532D Colles' fracture of left radius, subsequent encounter for closed fracture with routine healing: Secondary | ICD-10-CM

## 2021-12-09 NOTE — Patient Instructions (Addendum)
Good to see you today. ? ?Follow-up:in 3 weeks.  ? ?I have ordered a bone density test and wrist xray at the [facility] office.  ?Lets be clever and schedule that test the morning before you see me.  ? ? ?

## 2021-12-29 NOTE — Progress Notes (Unsigned)
   I, Christoper Fabian, LAT, ATC, am serving as scribe for Dr. Clementeen Graham.  Mary Rogers is a 56 y.o. female who presents to [facility] at [facility] today for f/u of L wrist pain due to a transverse fx of the distal radial fx w/ dorsal impaction that occurred on Saturday, 10/30/21, after suffering a fall, landing on an outstretched L hand.  She was last seen by Dr. Denyse Amass on 12/09/21 and was advised to con't immobilization in her Exos cast.  Today, pt reports that her L forearm is feeling better.  She con't to feel it but it is better.  She still cannot fully flex her hand.  Diagnostic testing: L wrist XR- 12/03/21, 11/19/21, 11/16/21, 11/01/21  Pertinent review of systems: No fevers or chills  Relevant historical information: Otherwise healthy   Exam:  BP 120/84 (BP Location: Right Arm, Patient Position: Sitting, Cuff Size: Normal)   Pulse 72   Ht 5\' 4"  (1.626 m)   Wt 200 lb 9.6 oz (91 kg)   SpO2 99%   BMI 34.43 kg/m  General: Well Developed, well nourished, and in no acute distress.   MSK: Left wrist swollen and tender to palpation mildly distal wrist.  Decreased range of motion. Hand decreased range of motion thumb and MCPs. Refill and sensation are intact distally.   Lab and Radiology Results  X-ray images left wrist obtained today personally and independently interpreted. Mildly angulated fracture distal radius.  No change in angulation or displacement. Mild callus formation present with fracture line still visible. Await formal radiology over read.    Assessment and Plan: 56 y.o. female with left wrist fracture now 2 months from initial injury.  Patient is still having slow healing on x-ray.  Tentatively plan to start hand therapy especially to work on hand range of motion.  She has very restricted thumb and hand motion and I am worried she is can have a very challenging time getting that back when we can start liberalizing wrist motion.  Recheck back in 1  month.   PDMP not reviewed this encounter. Orders Placed This Encounter  Procedures   DG Wrist Complete Left    Standing Status:   Future    Number of Occurrences:   1    Standing Expiration Date:   01/29/2022    Order Specific Question:   Reason for Exam (SYMPTOM  OR DIAGNOSIS REQUIRED)    Answer:   L wrist pain    Order Specific Question:   Is patient pregnant?    Answer:   No    Order Specific Question:   Preferred imaging location?    Answer:   01/31/2022   Ambulatory referral to Occupational Therapy    Referral Priority:   Routine    Referral Type:   Occupational Therapy    Referral Reason:   Specialty Services Required    Requested Specialty:   Occupational Therapy    Number of Visits Requested:   1   No orders of the defined types were placed in this encounter.    Discussed warning signs or symptoms. Please see discharge instructions. Patient expresses understanding.   The above documentation has been reviewed and is accurate and complete Kyra Searles, M.D.

## 2021-12-30 ENCOUNTER — Ambulatory Visit (INDEPENDENT_AMBULATORY_CARE_PROVIDER_SITE_OTHER): Payer: 59 | Admitting: Family Medicine

## 2021-12-30 ENCOUNTER — Ambulatory Visit (INDEPENDENT_AMBULATORY_CARE_PROVIDER_SITE_OTHER)
Admission: RE | Admit: 2021-12-30 | Discharge: 2021-12-30 | Disposition: A | Discharge status: Home / Self Care | Payer: 59 | Source: Ambulatory Visit | Attending: Family Medicine | Admitting: Family Medicine

## 2021-12-30 ENCOUNTER — Ambulatory Visit (INDEPENDENT_AMBULATORY_CARE_PROVIDER_SITE_OTHER): Payer: 59

## 2021-12-30 VITALS — BP 120/84 | HR 72 | Ht 64.0 in | Wt 200.6 lb

## 2021-12-30 DIAGNOSIS — M25532 Pain in left wrist: Secondary | ICD-10-CM | POA: Diagnosis not present

## 2021-12-30 DIAGNOSIS — Z780 Asymptomatic menopausal state: Secondary | ICD-10-CM | POA: Diagnosis not present

## 2021-12-30 DIAGNOSIS — S52532D Colles' fracture of left radius, subsequent encounter for closed fracture with routine healing: Secondary | ICD-10-CM | POA: Diagnosis not present

## 2021-12-30 NOTE — Patient Instructions (Addendum)
Good to see you today.  Please get an Xray today before you leave.  Follow-up: 1 month.   I've referred you to Occupational Therapy.  Let us know if you don't hear from them in one week.   Let me know if you find a hand therapy or occupational therapy location closer to you.

## 2021-12-31 NOTE — Progress Notes (Signed)
Bone density is mildly low but is not yet osteoporosis.  Continue calcium and vitamin D.

## 2022-01-04 ENCOUNTER — Telehealth: Payer: Self-pay | Admitting: Family Medicine

## 2022-01-04 NOTE — Progress Notes (Signed)
Left wrist x-ray shows some healing of the fracture.

## 2022-01-04 NOTE — Telephone Encounter (Signed)
Pt called for wrist xray results. Given, pt has several follow up questions.  Should she be out of the cast she has been wearing for 8 weeks? Should she be considering a plate as the fx is healing so slowly?  She also asked about the Dexa results, although these were given to her.

## 2022-01-05 ENCOUNTER — Telehealth: Payer: Self-pay

## 2022-01-05 NOTE — Telephone Encounter (Signed)
Dr. Georgina Snell called pt back per phone note on different message: "I called Mary Rogers back and talked about her situation.  She does have access to a bone stimulator from her friend.  Recommend starting that.  Recommend gentle hand therapy to at least get her thumb and fingers working better.  Recheck as scheduled."

## 2022-01-05 NOTE — Telephone Encounter (Signed)
I think it is okay to be out of the cast when sitting down at home work risk of injury is low.  Think is okay to start moving her hand and wrist a little bit.  Would recommend wearing the cast when you are walking around  Surgery I do not think it is indicated here.  I do think you have a follow-up appointment with the hand surgeon review get his opinion if you would like.  Bone density test showed osteopenia but not osteoporosis.

## 2022-01-05 NOTE — Telephone Encounter (Signed)
Patient called and would like a call back from Dr. Denyse Amass to talk about her fracture on the x rays and how much is healing and if it is healed enough to start therapy. She has scheduled the PT but wants to make sure she doesn't do any more harm since it has been 8 weeks

## 2022-01-05 NOTE — Telephone Encounter (Signed)
I called Mary Rogers back and talked about her situation.  She does have access to a bone stimulator from her friend.  Recommend starting that.  Recommend gentle hand therapy to at least get her thumb and fingers working better.  Recheck as scheduled.

## 2022-01-06 ENCOUNTER — Other Ambulatory Visit: Payer: Self-pay

## 2022-01-06 ENCOUNTER — Emergency Department (HOSPITAL_COMMUNITY): Payer: 59

## 2022-01-06 ENCOUNTER — Emergency Department (HOSPITAL_COMMUNITY)
Admission: EM | Admit: 2022-01-06 | Discharge: 2022-01-06 | Disposition: A | Discharge status: Home / Self Care | Payer: 59 | Attending: Emergency Medicine | Admitting: Emergency Medicine

## 2022-01-06 ENCOUNTER — Encounter (HOSPITAL_COMMUNITY): Payer: Self-pay

## 2022-01-06 DIAGNOSIS — S68623A 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, initial encounter: Secondary | ICD-10-CM | POA: Insufficient documentation

## 2022-01-06 DIAGNOSIS — M7989 Other specified soft tissue disorders: Secondary | ICD-10-CM | POA: Diagnosis not present

## 2022-01-06 DIAGNOSIS — S61211A Laceration without foreign body of left index finger without damage to nail, initial encounter: Secondary | ICD-10-CM

## 2022-01-06 DIAGNOSIS — W231XXA Caught, crushed, jammed, or pinched between stationary objects, initial encounter: Secondary | ICD-10-CM | POA: Insufficient documentation

## 2022-01-06 DIAGNOSIS — S68629A Partial traumatic transphalangeal amputation of unspecified finger, initial encounter: Secondary | ICD-10-CM

## 2022-01-06 DIAGNOSIS — N3000 Acute cystitis without hematuria: Secondary | ICD-10-CM | POA: Insufficient documentation

## 2022-01-06 DIAGNOSIS — S6992XA Unspecified injury of left wrist, hand and finger(s), initial encounter: Secondary | ICD-10-CM | POA: Diagnosis present

## 2022-01-06 DIAGNOSIS — Y9389 Activity, other specified: Secondary | ICD-10-CM | POA: Insufficient documentation

## 2022-01-06 LAB — URINALYSIS, ROUTINE W REFLEX MICROSCOPIC
Bilirubin Urine: NEGATIVE
Glucose, UA: NEGATIVE mg/dL
Ketones, ur: 20 mg/dL — AB
Nitrite: POSITIVE — AB
Protein, ur: NEGATIVE mg/dL
Specific Gravity, Urine: 1.005 (ref 1.005–1.030)
pH: 6 (ref 5.0–8.0)

## 2022-01-06 MED ORDER — LIDOCAINE HCL 2 % IJ SOLN
10.0000 mL | Freq: Once | INTRAMUSCULAR | Status: AC
  Administered 2022-01-06: 200 mg

## 2022-01-06 MED ORDER — LIDOCAINE HCL 2 % IJ SOLN
INTRAMUSCULAR | Status: AC
  Filled 2022-01-06: qty 20

## 2022-01-06 MED ORDER — OXYCODONE-ACETAMINOPHEN 5-325 MG PO TABS: 1.0000 | ORAL_TABLET | Freq: Four times a day (QID) | ORAL | 0 refills | Status: DC | PRN

## 2022-01-06 MED ORDER — CEPHALEXIN 500 MG PO CAPS: 500.0000 mg | ORAL_CAPSULE | Freq: Four times a day (QID) | ORAL | 0 refills | Status: DC

## 2022-01-06 MED ORDER — MORPHINE SULFATE (PF) 4 MG/ML IV SOLN
4.0000 mg | Freq: Once | INTRAVENOUS | Status: AC
  Administered 2022-01-06: 4 mg via INTRAVENOUS
  Filled 2022-01-06: qty 1

## 2022-01-06 MED ORDER — CEPHALEXIN 250 MG PO CAPS
500.0000 mg | ORAL_CAPSULE | Freq: Once | ORAL | Status: AC
Start: 2022-01-06 — End: 2022-01-06
  Administered 2022-01-06: 500 mg via ORAL
  Filled 2022-01-06: qty 2

## 2022-01-06 MED ORDER — OXYCODONE-ACETAMINOPHEN 5-325 MG PO TABS
1.0000 | ORAL_TABLET | Freq: Once | ORAL | Status: AC
  Administered 2022-01-06: 1 via ORAL
  Filled 2022-01-06: qty 1

## 2022-01-06 MED ORDER — LIDOCAINE HCL (PF) 2 % IJ SOLN
10.0000 mL | Freq: Once | INTRAMUSCULAR | Status: DC
Start: 2022-01-06 — End: 2022-01-06
  Filled 2022-01-06: qty 10

## 2022-01-06 NOTE — Discharge Instructions (Signed)
Leave the splint and bandages on if you are seeing the hand surgeon tomorrow.  If not change them on Saturday.  Try to keep your finger elevated to help with pain.  You can take the pain medication you have at home and a new prescription was sent to your pharmacy as well as the antibiotic.  Urine culture was done so if what is causing your infection is not treated with the Keflex someone will call you in give you a different prescription.

## 2022-01-06 NOTE — ED Notes (Signed)
This RN offered an In and out cath for the patient, patient reports that she will do it herself. Patient reports to this RN that it is okay to throw away her cut shirt.

## 2022-01-06 NOTE — ED Provider Notes (Signed)
[facility] EM[facility]GENCY [facility]MENT Provider Note   CSN: [id number] Arrival date & time: 01/06/22  1930     History  Chief Complaint  Patient presents with   Finger Injury    Fontaine Noancy Shutters is a 56 y.o. female.  Patient is a 56 year old female with a history of neurogenic bladder who self caths, recent injury with a fall and fracture to the left wrist with decreased mobility in her hand who is presenting today after getting her fingers caught in a circular saw.  Patient reports severe pain to the left index middle and fourth finger.  She believes that the middle finger is almost completely amputated.  She denies any other areas of injury.  Tetanus shot is up-to-date. Secondly patient reports that in the last few days she has noticed her urine has been foul-smelling and cloudy and she is concerned that she may have an infection.  She denies any abdominal pain or vomiting.  The history is provided by the patient.      Home Medications Prior to Admission medications   Medication Sig Start Date End Date Taking? Authorizing Provider  celecoxib (CELEBREX) 200 MG capsule One to 2 tablets by mouth daily as needed for pain. Patient not taking: Reported on 11/26/2021 11/01/21   Rodolph Bongorey, Evan S, MD  oxyCODONE-acetaminophen (PERCOCET/ROXICET) 5-325 MG tablet Take 1 tablet by mouth every 8 (eight) hours as needed for severe pain. Patient not taking: Reported on 11/26/2021 11/10/21   Rodolph Bongorey, Evan S, MD      Allergies    Patient has no known allergies.    Review of Systems   Review of Systems  Physical Exam Updated Vital Signs BP 136/76   Pulse 81   Temp 98.4 F (36.9 C)   Resp (!) 21   SpO2 98%  Physical Exam Vitals and nursing note reviewed.  Constitutional:      Comments: Crying and appears to be in significant pain  HENT:     Head: Normocephalic.  Cardiovascular:     Rate and Rhythm: Normal rate.  Musculoskeletal:        General: Tenderness present.       Hands:      Comments: Patient has minimal flexion at the DIP joint of all 4 fingers on the left hand.  Normal flexion at PIP and MCP joints in all 4 fingers  Skin:    General: Skin is warm.  Neurological:     Mental Status: Mental status is at baseline.  Psychiatric:        Mood and Affect: Mood normal.    ED Results / Procedures / Treatments   Labs (all labs ordered are listed, but only abnormal results are displayed) Labs Reviewed  URINALYSIS, ROUTINE W REFLEX MICROSCOPIC - Abnormal; Notable for the following components:      Result Value   APPearance HAZY (*)    Hgb urine dipstick SMALL (*)    Ketones, ur 20 (*)    Nitrite POSITIVE (*)    Leukocytes,Ua LARGE (*)    Bacteria, UA RARE (*)    All other components within normal limits  URINE CULTURE    EKG None  Radiology DG Hand Complete Left  Result Date: 01/06/2022 CLINICAL DATA:  Lacerations to fingers from salt. EXAM: LEFT HAND - COMPLETE 3+ VIEW COMPARISON:  12/30/2021 FINDINGS: Healing fracture in the distal left radial metaphysis again noted as seen on prior study. Fracture noted through the left middle finger distal phalanx and distal aspect of  the middle phalanx. No additional fractures. No subluxation or dislocation. IMPRESSION: Fractures in the left middle finger distal phalanx and distal aspect of the middle phalanx at the DIP joint. Healing subacute fracture in the distal left radial metaphysis. Electronically Signed   By: Charlett Nose M.D.   On: 01/06/2022 20:15    Procedures Procedures   LACERATION REPAIR Performed by: [facility] Authorized by: Gwyneth Sprout Consent: Verbal consent obtained. Risks and benefits: risks, benefits and alternatives were discussed Consent given by: patient Patient identity confirmed: provided demographic data Prepped and Draped in normal sterile fashion Wound explored  Laceration Location: left middle finger  Laceration Length: 3cm  No Foreign Bodies seen or  palpated  Anesthesia: digital block infiltration  Local anesthetic: lidocaine 2% without epinephrine  Anesthetic total: 3 ml  Irrigation method: syringe Amount of cleaning: standard  Skin closure: 4.0 prolene  Number of sutures: 8  Technique: simple interrupted  Patient tolerance: Patient tolerated the procedure well with no immediate complications. LACERATION REPAIR Performed by: [facility] Authorized by: Gwyneth Sprout Consent: Verbal consent obtained. Risks and benefits: risks, benefits and alternatives were discussed Consent given by: patient Patient identity confirmed: provided demographic data Prepped and Draped in normal sterile fashion Wound explored  Laceration Location: left index finger  Laceration Length: 1cm  No Foreign Bodies seen or palpated  Anesthesia: digital block  Local anesthetic: lidocaine 2% without epinephrine  Anesthetic total: 3 ml  Irrigation method: syringe Amount of cleaning: standard  Skin closure: 4.0 prolene  Number of sutures: 4  Technique: simple interrupted  Patient tolerance: Patient tolerated the procedure well with no immediate complications.   Medications Ordered in ED Medications  morphine (PF) 4 MG/ML injection 4 mg (has no administration in time range)  cephALEXin (KEFLEX) capsule 500 mg (has no administration in time range)  morphine (PF) 4 MG/ML injection 4 mg (4 mg Intravenous Given 01/06/22 2000)  lidocaine (XYLOCAINE) 2 % (with pres) injection 200 mg (200 mg Infiltration Given by Other 01/06/22 2036)    ED Course/ Medical Decision Making/ A&P                           Medical Decision Making Amount and/or Complexity of Data Reviewed Labs: ordered. Decision-making details documented in ED Course. Radiology: ordered and independent interpretation performed. Decision-making details documented in ED Course.  Risk Prescription drug management.   Patient presenting today after she got her hand caught  in the circular saw.  She has lacerations to the index middle and third finger on the left hand.  The middle thing finger is the most severe with partial amputation but still less than 3-second capillary refill in the distal portion and no exposed bone.  The nail is intact.  Laceration on the other 2 fingers are simple lacerations that were repaired as above. I have independently visualized and interpreted pt's images today. Images today show fracture of the distal and middle phalanx of the left index finger.  Findings were discussed with the patient and her husband.  Middle finger wound was repaired and slightly dusky but still has capillary refill of less than 3 seconds.  Patient is unable to bend at the DIP joint however on further review she reports that since she injured her wrist weeks ago she has had problems bending at that joint and could not do that even before she got her finger stuck in the circular saw.  Concerned that she could have a  tendon injury.  Wounds were wrapped with Xeroform and she was placed in a finger splint.  Encouraged her to follow-up with hand surgery with Dr. Frazier Butt.  Patient given pain medication and Keflex for open fracture.  She was also complaining of UTI type symptoms that she self caths for neurogenic bladder.  I independently interpreted patient's urine today and it does show nitrite and leukocyte positive with greater than 50 white cells but rare bacteria.  Keflex should cover this and a urine culture was done.        Final Clinical Impression(s) / ED Diagnoses Final diagnoses:  Partial traumatic amputation of finger through phalanx, initial encounter  Laceration of left index finger without foreign body without damage to nail, initial encounter  Acute cystitis without hematuria    Rx / DC Orders ED Discharge Orders     None         Gwyneth Sprout, MD 01/06/22 2237

## 2022-01-06 NOTE — ED Notes (Signed)
Ortho tech called at this time and reports that he will be by shortly

## 2022-01-06 NOTE — Progress Notes (Signed)
Orthopedic Tech Progress Note Patient Details:  Mary Rogers 1966-06-11 [id number]  Ortho Devices Type of Ortho Device: Finger splint Ortho Device/Splint Location: lue Ortho Device/Splint Interventions: Ordered, Adjustment, Application   Post Interventions Patient Tolerated: Poor  Edwina Barth 01/06/2022, 11:43 PM

## 2022-01-06 NOTE — ED Notes (Signed)
Wounds on fingers cleaned and gauze + dressing applied. Patient verbalizes understanding of RICE acronym and how to care for wounds at home. Await ortho tech to come to bedside.

## 2022-01-06 NOTE — ED Triage Notes (Addendum)
Patient was cutting something with a circular saw strongest than she was anticipation and sustained a laceration severely so to middle finger and a lac to the first and third finger. Middle finger partially amputated. Patient reports a wrist fracture 8 weeks ago which is still healing. Patient denies taking any medications at home. Patient received 100 fentanyl,  200 IVF, 50 Ketamine with EMS. Bleeding controlled in triage and wrapped in pressure dressing. Patient reports that she took hemp prior to use of the saw. Patient can feel first and third finger but cannot feel the tip of her middle finger.

## 2022-01-06 NOTE — ED Notes (Signed)
Splint applied by ortho tech at bedside at this time

## 2022-01-06 NOTE — ED Notes (Signed)
Provider at bedside at this time for bedside procedure

## 2022-01-06 NOTE — ED Notes (Signed)
Provider notified of patients pain level.

## 2022-01-07 ENCOUNTER — Ambulatory Visit (INDEPENDENT_AMBULATORY_CARE_PROVIDER_SITE_OTHER): Payer: 59

## 2022-01-07 ENCOUNTER — Ambulatory Visit: Payer: 59 | Admitting: Orthopedic Surgery

## 2022-01-07 VITALS — BP 115/76 | HR 55 | Ht 64.0 in | Wt 201.0 lb

## 2022-01-07 DIAGNOSIS — S61211A Laceration without foreign body of left index finger without damage to nail, initial encounter: Secondary | ICD-10-CM

## 2022-01-07 DIAGNOSIS — S68623A 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, initial encounter: Secondary | ICD-10-CM | POA: Diagnosis not present

## 2022-01-07 NOTE — Progress Notes (Signed)
Office Visit Note   Patient: Mary Rogers           Date of Birth: [birth year]           MRN: [id number] Visit Date: 01/07/2022              Requested by: No referring provider defined for this encounter. PCP: Pcp, No   Assessment & Plan: Visit Diagnoses:  1. Partial traumatic amputation of left middle finger through phalanx, initial encounter   2. Laceration of left index finger without foreign body without damage to nail, initial encounter     Plan: Unfortunately, patient sustained a hand saw injury to the left middle and index fingers yesterday.  She had a near complete amputation of the left middle finger.  The wounds were thoroughly irrigated in the emergency department and closed with Prolene sutures.  I was not consulted at the time but patient was instructed to follow-up with me today.  Discussed with patient and her husband that the tip of the middle finger appears to be viable at this point.  It is somewhat hyperemic with very brisk capillary refill concerning for a venous outflow issue.  She has no sensation on the tip of this finger.  I reviewed her x-rays today which demonstrate a comminuted fracture of the middle finger distal phalangeal base with destruction of the articular surface.  Discussed that I would have a very difficult time with K wire fixation of this fracture given the comminution and articular destruction.  Discussed with patient and husband that we may be better off just allowing the soft tissue to heal and allowing the fracture to consolidate.  If she remains symptomatic after that, then we can discuss possible arthrodesis of the DIP joint.  We also discussed revision amputation of the middle finger, however, patient was not interested in this option.  She has a very small laceration at the radial aspect of the index finger with no evidence of tendon or neurovascular injury.  Her wounds were dressed with Adaptic, Xeroform, 4 x 4's, and she was placed in a dorsal  blocking splint in intrinsic plus position to protect her soft tissue injuries.  I will see her back in a week for another wound check.  Follow-Up Instructions: No follow-ups on file.   Orders:  Orders Placed This Encounter  Procedures   XR Finger Middle Left   No orders of the defined types were placed in this encounter.     Procedures: No procedures performed   Clinical Data: No additional findings.   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  She was seen in the ER last night where she was found to have a near complete amputation of this left middle finger through the distal phalanx and a laceration of the neck.  The wounds were thoroughly irrigated and closed in the ER.  I was not consulted at that time.  She presents today for follow-up.  She describes significant pain at the tip of the middle finger.  She has no sensation in the middle finger distal to the suture line which is near circumferential.  She is in a soft dressing with an aluminum splint to the middle finger  and remains in her removable wrist brace.  She has a healing left distal wrist fracture that is being treated nonoperatively.  Unfortunately, she is having a hard time with finger rehab and has some finger stiffness at baseline secondary to this.  Injury   Review of Systems   Objective: Vital Signs: BP 115/76 (BP Location: Right Arm, Patient Position: Sitting)   Pulse (!) 55   Ht 5\' 4"  (1.626 m)   Wt 201 lb (91.2 kg)   BMI 34.50 kg/m   Physical Exam Constitutional:      Appearance: Normal appearance.  Cardiovascular:     Rate and Rhythm: Normal rate.     Pulses: Normal pulses.  Pulmonary:     Effort: Pulmonary effort is  normal.  Skin:    General: Skin is warm and dry.     Capillary Refill: Capillary refill takes less than 2 seconds.  Neurological:     Mental Status: She is alert.    Right Hand Exam   Tenderness  Right hand tenderness location: TTP at tips of index and middle fingers.  Comments:  Near circumferential laceration of left middle finger at the DIP joint and base of distal phalanx.  The tip of the finger is hyperemic with very brisk capillary refill.  The wound was closed with Prolene sutures.  She has no active range of motion of this finger secondary to pain.  She has no sensation to light touch or sharp distal to the suture line.  She has a laceration at the radial aspect of the index finger.  The margins are well approximated.  She has full range of motion of this finger limited somewhat by pain.  She is neurovascular intact the tip of the finger.     Specialty Comments:  No specialty comments available.  Imaging: DG Hand Complete Left  Result Date: 01/06/2022 CLINICAL DATA:  Lacerations to fingers from salt. EXAM: LEFT HAND - COMPLETE 3+ VIEW COMPARISON:  12/30/2021 FINDINGS: Healing fracture in the distal left radial metaphysis again noted as seen on prior study. Fracture noted through the left middle finger distal phalanx and distal aspect of the middle phalanx. No additional fractures. No subluxation or dislocation. IMPRESSION: Fractures in the left middle finger distal phalanx and distal aspect of the middle phalanx at the DIP joint. Healing subacute fracture in the distal left radial metaphysis. Electronically Signed   By: Rolm Baptise M.D.   On: 01/06/2022 20:15     PMFS History: Patient Active Problem List   Diagnosis Date Noted   Laceration of left index finger 01/07/2022   Fracture, Colles, left, closed 11/16/2021   No past medical history on file.  No family history on file.  No past surgical history on file. Social History   Occupational History   Not on file   Tobacco Use   Smoking status: Not on file   Smokeless tobacco: Not on file  Substance and Sexual Activity   Alcohol use: Not on file   Drug use: Not on file   Sexual activity: Not on file

## 2022-01-08 LAB — URINE CULTURE: Culture: >=100000 — AB

## 2022-01-09 ENCOUNTER — Telehealth (HOSPITAL_BASED_OUTPATIENT_CLINIC_OR_DEPARTMENT_OTHER): Payer: Self-pay | Admitting: *Deleted

## 2022-01-09 NOTE — Telephone Encounter (Signed)
Post ED Visit - Positive Culture Follow-up  Culture report reviewed by antimicrobial stewardship pharmacist: [facility] []  , Pharm.D. []  Enzo Bi, Pharm.D., BCPS AQ-ID []  , Pharm.D., BCPS []  Celedonio Miyamoto, .D., BCPS []  [place], .D., BCPS, AAHIVP []  Georgina Pillion, Pharm.D., BCPS, AAHIVP []  [address], PharmD, BCPS []  , PharmD, BCPS []  [place], PharmD, BCPS []  [address], PharmD []  , PharmD, BCPS [x]  Estella Husk, PharmD  Pharmacy Team []  Lysle Pearl, PharmD []  , PharmD []  Phillips Climes, PharmD []  , Rph []  Agapito Games) , PharmD []  Verlan Friends, PharmD []  , PharmD []  Mervyn Gay, PharmD []  , PharmD []  Delmar Landau, PharmD []  [facility], PharmD []  , PharmD []  Len Childs, PharmD   Positive urine culture Treated with Cephalexin, organism sensitive to the same and no further patient follow-up is required at this time.  01/09/2022, 3:29 PM

## 2022-01-10 ENCOUNTER — Telehealth: Payer: Self-pay | Admitting: Orthopedic Surgery

## 2022-01-10 ENCOUNTER — Telehealth: Payer: Self-pay

## 2022-01-10 NOTE — Telephone Encounter (Signed)
Contacted patient and made her aware. She states that she was not in her right mind when she made decisions about her middle finger. She would like to discuss if surgical option to amputation would be best or not. States that she is very active and she would like to do what is best healing wise.   [phone] (home)

## 2022-01-10 NOTE — Telephone Encounter (Signed)
APS form received. To [employer].

## 2022-01-10 NOTE — Telephone Encounter (Signed)
Patient contacted the office and would like to know if she is able to re wrap her fingers or should she wait until Friday at her appointment.

## 2022-01-14 ENCOUNTER — Ambulatory Visit: Payer: Self-pay

## 2022-01-14 ENCOUNTER — Ambulatory Visit (INDEPENDENT_AMBULATORY_CARE_PROVIDER_SITE_OTHER): Payer: 59 | Admitting: Orthopedic Surgery

## 2022-01-14 DIAGNOSIS — S52532D Colles' fracture of left radius, subsequent encounter for closed fracture with routine healing: Secondary | ICD-10-CM

## 2022-01-14 DIAGNOSIS — S61211A Laceration without foreign body of left index finger without damage to nail, initial encounter: Secondary | ICD-10-CM

## 2022-01-14 DIAGNOSIS — S62639B Displaced fracture of distal phalanx of unspecified finger, initial encounter for open fracture: Secondary | ICD-10-CM

## 2022-01-14 DIAGNOSIS — S61213A Laceration without foreign body of left middle finger without damage to nail, initial encounter: Secondary | ICD-10-CM

## 2022-01-14 NOTE — H&P (View-Only) (Signed)
Office Visit Note   Patient: Mary Rogers           Date of Birth: [birth year]           MRN: [id number] Visit Date: 01/14/2022              Requested by: No referring provider defined for this encounter. PCP: Pcp, No   Assessment & Plan: Visit Diagnoses:  1. Closed Colles' fracture of left radius with routine healing, subsequent encounter   2. Laceration of left index finger without foreign body without damage to nail, initial encounter   3. Open displaced fracture of distal phalanx of finger of left hand   4. Laceration of left middle finger without foreign body without damage to nail, initial encounter     Plan: Patient presents today to discuss her left middle finger injury further.  At her last visit on 01/07/2022 she was quite distraught following her injury.  We discussed the nature of her injury and the significant bony and soft tissue components.  Our plan at that time was to allow the soft tissue injury to heal and then address any bony issues.  She has been giving this injury a lot of thought.  We again discussed that she has a significant injury to the tip of this middle finger.  She had a near circumferential laceration of the fingertip with a severely comminuted fracture of the distal phalanx secondary to the saw injury.  The tip of her finger is insensate at this point.  She is very active with gardening, yard work, and things around the house.  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  After our discussion, she would like to proceed with an amputation through the DIP joint of this middle finger.  She would like to return to work on her activities as soon as possible and believes that amputation will provide that opportunity.  I discussed the risks of surgery including bleeding, infection, sensitivity to fingertip, incomplete symptom relief, need for additional surgeries.  We will plan on doing this soon as possible and a surgical date and time will be  confirmed with the patient.  Regarding her distal radius fracture, repeat x-rays today show interval healing of the fracture with acceptable alignment.  She needs to begin therapy to start working on range of motion and strengthening.  Follow-Up Instructions: No follow-ups on file.   Orders:  Orders Placed This Encounter  Procedures   XR Wrist Complete Left   No orders of the defined types were placed in this encounter.     Procedures: No procedures performed   Clinical Data: No additional findings.   Subjective: Chief Complaint  Patient presents with   Left Middle Finger - Follow-up   Left Index Finger - Follow-up    This is a 56 year old right-hand-dominant female who presents for follow-up of a small injury to the left hand.  She was last seen on 01/07/2022 which time she was found to have a near circumferential laceration to the tip of the middle finger from the level of the DIP flexion crease.  She has a severely comminuted fracture of the distal phalanx with obstruction of the articular surface.  Her wounds were thoroughly irrigated and closed in the ER and I was not consulted at that time.  She was placed in a bulky dressing her last visit with a splint in place.  Her symptoms are unchanged.  She has been thinking about her fingertip  injury and presents today to further discuss treatment options.  Her wrist is still somewhat painful, although this is overall improved over time.  She notes that she was running through some weeds recently and moved her wrist a certain way and felt some sharp pain.    Review of Systems   Objective: Vital Signs: There were no vitals taken for this visit.  Physical Exam  Left Hand Exam   Tenderness  Left hand tenderness location: TTP at tip of middle and index fingers .   Other  Erythema: absent Sensation: normal Pulse: present  Comments:  Skin around middle finger laceration is macerated.  Prolene sutures in place.  Near  circumferential laceration around middle finger at level of DIP flexion crease.  No sensation in tip of finger.       Specialty Comments:  No specialty comments available.  Imaging: No results found.   PMFS History: Patient Active Problem List   Diagnosis Date Noted   Open displaced fracture of distal phalanx of finger of left hand 01/14/2022   Laceration of left middle finger w/o foreign body w/o damage to nail 01/14/2022   Laceration of left index finger 01/07/2022   Fracture, Colles, left, closed 11/16/2021   No past medical history on file.  No family history on file.  No past surgical history on file. Social History   Occupational History   Not on file  Tobacco Use   Smoking status: Not on file   Smokeless tobacco: Not on file  Substance and Sexual Activity   Alcohol use: Not on file   Drug use: Not on file   Sexual activity: Not on file

## 2022-01-14 NOTE — Progress Notes (Signed)
Office Visit Note   Patient: Mary Rogers           Date of Birth: [birth year]           MRN: [id number] Visit Date: 01/14/2022              Requested by: No referring provider defined for this encounter. PCP: Pcp, No   Assessment & Plan: Visit Diagnoses:  1. Closed Colles' fracture of left radius with routine healing, subsequent encounter   2. Laceration of left index finger without foreign body without damage to nail, initial encounter   3. Open displaced fracture of distal phalanx of finger of left hand   4. Laceration of left middle finger without foreign body without damage to nail, initial encounter     Plan: Patient presents today to discuss her left middle finger injury further.  At her last visit on 01/07/2022 she was quite distraught following her injury.  We discussed the nature of her injury and the significant bony and soft tissue components.  Our plan at that time was to allow the soft tissue injury to heal and then address any bony issues.  She has been giving this injury a lot of thought.  We again discussed that she has a significant injury to the tip of this middle finger.  She had a near circumferential laceration of the fingertip with a severely comminuted fracture of the distal phalanx secondary to the saw injury.  The tip of her finger is insensate at this point.  She is very active with gardening, yard work, and things around the house.  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  After our discussion, she would like to proceed with an amputation through the DIP joint of this middle finger.  She would like to return to work on her activities as soon as possible and believes that amputation will provide that opportunity.  I discussed the risks of surgery including bleeding, infection, sensitivity to fingertip, incomplete symptom relief, need for additional surgeries.  We will plan on doing this soon as possible and a surgical date and time will be  confirmed with the patient.  Regarding her distal radius fracture, repeat x-rays today show interval healing of the fracture with acceptable alignment.  She needs to begin therapy to start working on range of motion and strengthening.  Follow-Up Instructions: No follow-ups on file.   Orders:  Orders Placed This Encounter  Procedures   XR Wrist Complete Left   No orders of the defined types were placed in this encounter.     Procedures: No procedures performed   Clinical Data: No additional findings.   Subjective: Chief Complaint  Patient presents with   Left Middle Finger - Follow-up   Left Index Finger - Follow-up    This is a 56 year old right-hand-dominant female who presents for follow-up of a small injury to the left hand.  She was last seen on 01/07/2022 which time she was found to have a near circumferential laceration to the tip of the middle finger from the level of the DIP flexion crease.  She has a severely comminuted fracture of the distal phalanx with obstruction of the articular surface.  Her wounds were thoroughly irrigated and closed in the ER and I was not consulted at that time.  She was placed in a bulky dressing her last visit with a splint in place.  Her symptoms are unchanged.  She has been thinking about her fingertip  injury and presents today to further discuss treatment options.  Her wrist is still somewhat painful, although this is overall improved over time.  She notes that she was running through some weeds recently and moved her wrist a certain way and felt some sharp pain.    Review of Systems   Objective: Vital Signs: There were no vitals taken for this visit.  Physical Exam  Left Hand Exam   Tenderness  Left hand tenderness location: TTP at tip of middle and index fingers .   Other  Erythema: absent Sensation: normal Pulse: present  Comments:  Skin around middle finger laceration is macerated.  Prolene sutures in place.  Near  circumferential laceration around middle finger at level of DIP flexion crease.  No sensation in tip of finger.       Specialty Comments:  No specialty comments available.  Imaging: No results found.   PMFS History: Patient Active Problem List   Diagnosis Date Noted   Open displaced fracture of distal phalanx of finger of left hand 01/14/2022   Laceration of left middle finger w/o foreign body w/o damage to nail 01/14/2022   Laceration of left index finger 01/07/2022   Fracture, Colles, left, closed 11/16/2021   No past medical history on file.  No family history on file.  No past surgical history on file. Social History   Occupational History   Not on file  Tobacco Use   Smoking status: Not on file   Smokeless tobacco: Not on file  Substance and Sexual Activity   Alcohol use: Not on file   Drug use: Not on file   Sexual activity: Not on file

## 2022-01-18 ENCOUNTER — Telehealth: Payer: Self-pay

## 2022-01-18 NOTE — Telephone Encounter (Signed)
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

## 2022-01-18 NOTE — Telephone Encounter (Signed)
Please advise estimated time frame of patient being out of work. Scheduled for REVISION AMPUTATION LEFT MIDDLE FINGER on 01/24/2022

## 2022-01-18 NOTE — Telephone Encounter (Signed)
Patient is requesting another out of work note. She said the one she had has her returning to work on 6/12. She is scheduled for surgery on 6/19, so she is requesting a note for her to be out or work this week and until Dr. Frazier Butt thinks she can return to work after her surgery. She is requesting that this be e-mailed to her if possible.

## 2022-01-21 ENCOUNTER — Other Ambulatory Visit: Payer: Self-pay

## 2022-01-21 ENCOUNTER — Encounter (HOSPITAL_COMMUNITY): Payer: Self-pay | Admitting: Orthopedic Surgery

## 2022-01-21 NOTE — Progress Notes (Signed)
PCP - not now Cardiologist - denies  PPM/ICD - denies  Chest x-ray - n/a EKG - n/a Stress Test - denies ECHO - denies Cardiac Cath - denies  CPAP - n/a  Fasting Blood Sugar - n/a  Blood Thinner Instructions: n/a  ERAS Protcol - yes, until 13:00 o'clock  COVID TEST- n/a  Anesthesia review: no  Patient verbally denies any shortness of breath, fever, cough and chest pain during phone call   -------------  SDW INSTRUCTIONS given:  Your procedure is scheduled on Monday, June 19th, 2023.  Report to [facility] Main Entrance "A" at 13:30 P.M., and check in at the Admitting office.  Call this number if you have problems the morning of surgery:  [phone]   Remember:  Do not eat after midnight the night before your surgery  You may drink clear liquids until 13:00 the day of your surgery.   Clear liquids allowed are: Water, Non-Citrus Juices (without pulp), Carbonated Beverages, Clear Tea, Black Coffee Only, and Gatorade    Take these medicines the morning of surgery with A SIP OF WATER Keflex; Oxycodone - prn  As of today, STOP taking any Aspirin (unless otherwise instructed by your surgeon) Aleve, Naproxen, Ibuprofen, Motrin, Advil, Goody's, BC's, all herbal medications, fish oil, and all vitamins.   The day of surgery:                     Do not wear jewelry, make up, or nail polish            Do not wear lotions, powders, perfumes, or deodorant.            Do not shave 48 hours prior to surgery.              Do not bring valuables to the hospital.            [facility] is not responsible for any belongings or valuables.  Do NOT Smoke (Tobacco/Vaping) 24 hours prior to your procedure If you use a CPAP at night, you may bring all equipment for your overnight stay.   Contacts, glasses, dentures or bridgework may not be worn into surgery.      For patients admitted to the hospital, discharge time will be determined by your treatment team.   Patients discharged the  day of surgery will not be allowed to drive home, and someone needs to stay with them for 24 hours.   Special instructions:   [facility]- Preparing For Surgery  Before surgery, you can play an important role. Because skin is not sterile, your skin needs to be as free of germs as possible. You can reduce the number of germs on your skin by washing with CHG (chlorahexidine gluconate) Soap before surgery.  CHG is an antiseptic cleaner which kills germs and bonds with the skin to continue killing germs even after washing.    Oral Hygiene is also important to reduce your risk of infection.  Remember - BRUSH YOUR TEETH THE MORNING OF SURGERY WITH YOUR REGULAR TOOTHPASTE  Please do not use if you have an allergy to CHG or antibacterial soaps. If your skin becomes reddened/irritated stop using the CHG.  Do not shave (including legs and underarms) for at least 48 hours prior to first CHG shower. It is OK to shave your face.  Please follow these instructions carefully.   Shower the NIGHT BEFORE SURGERY and the MORNING OF SURGERY with DIAL Soap.   Pat yourself dry  with a CLEAN TOWEL.  Wear CLEAN PAJAMAS to bed the night before surgery  Place CLEAN SHEETS on your bed the night of your first shower and DO NOT SLEEP WITH PETS.   Day of Surgery: Please shower morning of surgery  Wear Clean/Comfortable clothing the morning of surgery Do not apply any deodorants/lotions.   Remember to brush your teeth WITH YOUR REGULAR TOOTHPASTE.   Questions were answered. Patient verbalized understanding of instructions.

## 2022-01-24 ENCOUNTER — Encounter (HOSPITAL_COMMUNITY)
Admission: RE | Disposition: A | Discharge status: Home / Self Care | Payer: Self-pay | Source: Ambulatory Visit | Attending: Orthopedic Surgery

## 2022-01-24 ENCOUNTER — Ambulatory Visit (HOSPITAL_BASED_OUTPATIENT_CLINIC_OR_DEPARTMENT_OTHER): Payer: 59 | Admitting: Anesthesiology

## 2022-01-24 ENCOUNTER — Other Ambulatory Visit: Payer: Self-pay

## 2022-01-24 ENCOUNTER — Ambulatory Visit (HOSPITAL_COMMUNITY)
Admission: RE | Admit: 2022-01-24 | Discharge: 2022-01-24 | Disposition: A | Discharge status: Home / Self Care | Payer: 59 | Source: Ambulatory Visit | Attending: Orthopedic Surgery | Admitting: Orthopedic Surgery

## 2022-01-24 ENCOUNTER — Ambulatory Visit (HOSPITAL_COMMUNITY): Payer: 59 | Admitting: Anesthesiology

## 2022-01-24 ENCOUNTER — Encounter (HOSPITAL_COMMUNITY): Payer: Self-pay | Admitting: Orthopedic Surgery

## 2022-01-24 DIAGNOSIS — S52532D Colles' fracture of left radius, subsequent encounter for closed fracture with routine healing: Secondary | ICD-10-CM | POA: Diagnosis not present

## 2022-01-24 DIAGNOSIS — W312XXA Contact with powered woodworking and forming machines, initial encounter: Secondary | ICD-10-CM | POA: Diagnosis not present

## 2022-01-24 DIAGNOSIS — S61213A Laceration without foreign body of left middle finger without damage to nail, initial encounter: Secondary | ICD-10-CM

## 2022-01-24 DIAGNOSIS — S68623A 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, initial encounter: Secondary | ICD-10-CM

## 2022-01-24 HISTORY — DX: Fatty (change of) liver, not elsewhere classified: K76.0

## 2022-01-24 HISTORY — DX: Diverticulitis of intestine, part unspecified, without perforation or abscess without bleeding: K57.92

## 2022-01-24 HISTORY — DX: Neuromuscular dysfunction of bladder, unspecified: N31.9

## 2022-01-24 HISTORY — PX: AMPUTATION: SHX166

## 2022-01-24 LAB — COMPREHENSIVE METABOLIC PANEL
ALT: 20 U/L (ref 0–44)
AST: 21 U/L (ref 15–41)
Albumin: 3.9 g/dL (ref 3.5–5.0)
Alkaline Phosphatase: 47 U/L (ref 38–126)
Anion gap: 11 (ref 5–15)
BUN: 11 mg/dL (ref 6–20)
CO2: 21 mmol/L — ABNORMAL LOW (ref 22–32)
Calcium: 9.1 mg/dL (ref 8.9–10.3)
Chloride: 106 mmol/L (ref 98–111)
Creatinine, Ser: 1.13 mg/dL — ABNORMAL HIGH (ref 0.44–1.00)
GFR, Estimated: 57 mL/min — ABNORMAL LOW (ref >60)
Glucose, Bld: 109 mg/dL — ABNORMAL HIGH (ref 70–99)
Potassium: 3.4 mmol/L — ABNORMAL LOW (ref 3.5–5.1)
Sodium: 138 mmol/L (ref 135–145)
Total Bilirubin: 0.4 mg/dL (ref 0.3–1.2)
Total Protein: 6.9 g/dL (ref 6.5–8.1)

## 2022-01-24 LAB — CBC
HCT: 39.9 % (ref 36.0–46.0)
Hemoglobin: 13.8 g/dL (ref 12.0–15.0)
MCH: 32.1 pg (ref 26.0–34.0)
MCHC: 34.6 g/dL (ref 30.0–36.0)
MCV: 92.8 fL (ref 80.0–100.0)
Platelets: 249 10*3/uL (ref 150–400)
RBC: 4.3 MIL/uL (ref 3.87–5.11)
RDW: 13.1 % (ref 11.5–15.5)
WBC: 7.2 10*3/uL (ref 4.0–10.5)
nRBC: 0 % (ref 0.0–0.2)

## 2022-01-24 SURGERY — AMPUTATION DIGIT
Anesthesia: General | Site: Middle Finger | Laterality: Left

## 2022-01-24 MED ORDER — FENTANYL CITRATE (PF) 100 MCG/2ML IJ SOLN
25.0000 ug | INTRAMUSCULAR | Status: DC | PRN
  Administered 2022-01-24 (×3): 50 ug via INTRAVENOUS

## 2022-01-24 MED ORDER — ONDANSETRON HCL 4 MG/2ML IJ SOLN
INTRAMUSCULAR | Status: AC
Start: 2022-01-24 — End: ?
  Filled 2022-01-24: qty 2

## 2022-01-24 MED ORDER — LIDOCAINE 2% (20 MG/ML) 5 ML SYRINGE
INTRAMUSCULAR | Status: DC | PRN
  Administered 2022-01-24: 60 mg via INTRAVENOUS

## 2022-01-24 MED ORDER — ACETAMINOPHEN 500 MG PO TABS
1000.0000 mg | ORAL_TABLET | Freq: Once | ORAL | Status: DC
  Filled 2022-01-24: qty 2

## 2022-01-24 MED ORDER — LACTATED RINGERS IV SOLN: INTRAVENOUS | Status: DC

## 2022-01-24 MED ORDER — FENTANYL CITRATE (PF) 100 MCG/2ML IJ SOLN
INTRAMUSCULAR | Status: AC
  Filled 2022-01-24: qty 2

## 2022-01-24 MED ORDER — BUPIVACAINE HCL (PF) 0.25 % IJ SOLN
INTRAMUSCULAR | Status: DC | PRN
  Administered 2022-01-24: 10 mL

## 2022-01-24 MED ORDER — PROPOFOL 10 MG/ML IV BOLUS
INTRAVENOUS | Status: AC
Start: 2022-01-24 — End: ?
  Filled 2022-01-24: qty 20

## 2022-01-24 MED ORDER — BUPIVACAINE HCL (PF) 0.25 % IJ SOLN
INTRAMUSCULAR | Status: AC
Start: 2022-01-24 — End: ?
  Filled 2022-01-24: qty 30

## 2022-01-24 MED ORDER — DEXAMETHASONE SODIUM PHOSPHATE 10 MG/ML IJ SOLN
INTRAMUSCULAR | Status: DC | PRN
  Administered 2022-01-24: 5 mg via INTRAVENOUS

## 2022-01-24 MED ORDER — BACITRACIN ZINC 500 UNIT/GM EX OINT
TOPICAL_OINTMENT | CUTANEOUS | Status: DC | PRN
  Administered 2022-01-24: 1 via TOPICAL

## 2022-01-24 MED ORDER — ORAL CARE MOUTH RINSE: 15.0000 mL | Freq: Once | OROMUCOSAL | Status: AC

## 2022-01-24 MED ORDER — CHLORHEXIDINE GLUCONATE 0.12 % MT SOLN
15.0000 mL | Freq: Once | OROMUCOSAL | Status: AC
  Administered 2022-01-24: 15 mL via OROMUCOSAL
  Filled 2022-01-24: qty 15

## 2022-01-24 MED ORDER — ONDANSETRON HCL 4 MG/2ML IJ SOLN
INTRAMUSCULAR | Status: DC | PRN
  Administered 2022-01-24: 4 mg via INTRAVENOUS

## 2022-01-24 MED ORDER — MIDAZOLAM HCL 2 MG/2ML IJ SOLN
INTRAMUSCULAR | Status: DC | PRN
  Administered 2022-01-24: 2 mg via INTRAVENOUS

## 2022-01-24 MED ORDER — MIDAZOLAM HCL 2 MG/2ML IJ SOLN
INTRAMUSCULAR | Status: AC
Start: 2022-01-24 — End: ?
  Filled 2022-01-24: qty 2

## 2022-01-24 MED ORDER — FENTANYL CITRATE (PF) 250 MCG/5ML IJ SOLN
INTRAMUSCULAR | Status: AC
  Filled 2022-01-24: qty 5

## 2022-01-24 MED ORDER — AMISULPRIDE (ANTIEMETIC) 5 MG/2ML IV SOLN: 10.0000 mg | Freq: Once | INTRAVENOUS | Status: DC | PRN

## 2022-01-24 MED ORDER — PROPOFOL 10 MG/ML IV BOLUS
INTRAVENOUS | Status: DC | PRN
  Administered 2022-01-24: 20 mg via INTRAVENOUS
  Administered 2022-01-24: 180 mg via INTRAVENOUS

## 2022-01-24 MED ORDER — OXYCODONE HCL 5 MG/5ML PO SOLN: 5.0000 mg | Freq: Once | ORAL | Status: AC | PRN

## 2022-01-24 MED ORDER — CEFAZOLIN SODIUM-DEXTROSE 2-4 GM/100ML-% IV SOLN
2.0000 g | INTRAVENOUS | Status: AC
  Administered 2022-01-24: 2 g via INTRAVENOUS
  Filled 2022-01-24: qty 100

## 2022-01-24 MED ORDER — OXYCODONE HCL 5 MG PO TABS
5.0000 mg | ORAL_TABLET | Freq: Once | ORAL | Status: AC | PRN
  Administered 2022-01-24: 5 mg via ORAL

## 2022-01-24 MED ORDER — DEXAMETHASONE SODIUM PHOSPHATE 10 MG/ML IJ SOLN
INTRAMUSCULAR | Status: AC
Start: 2022-01-24 — End: ?
  Filled 2022-01-24: qty 1

## 2022-01-24 MED ORDER — LIDOCAINE 2% (20 MG/ML) 5 ML SYRINGE
INTRAMUSCULAR | Status: AC
Start: 2022-01-24 — End: ?
  Filled 2022-01-24: qty 5

## 2022-01-24 MED ORDER — ACETAMINOPHEN 500 MG PO TABS
500.0000 mg | ORAL_TABLET | Freq: Once | ORAL | Status: AC
  Administered 2022-01-24: 500 mg via ORAL

## 2022-01-24 MED ORDER — PROPOFOL 10 MG/ML IV BOLUS
INTRAVENOUS | Status: AC
  Filled 2022-01-24: qty 20

## 2022-01-24 MED ORDER — LACTATED RINGERS IV SOLN: INTRAVENOUS | Status: DC | PRN

## 2022-01-24 MED ORDER — OXYCODONE HCL 5 MG PO TABS
ORAL_TABLET | ORAL | Status: AC
  Filled 2022-01-24: qty 1

## 2022-01-24 MED ORDER — BACITRACIN ZINC 500 UNIT/GM EX OINT
TOPICAL_OINTMENT | CUTANEOUS | Status: AC
Start: 2022-01-24 — End: ?
  Filled 2022-01-24: qty 28.35

## 2022-01-24 MED ORDER — FENTANYL CITRATE (PF) 250 MCG/5ML IJ SOLN
INTRAMUSCULAR | Status: DC | PRN
  Administered 2022-01-24 (×2): 50 ug via INTRAVENOUS

## 2022-01-24 MED ORDER — OXYCODONE HCL 5 MG PO TABS: 5.0000 mg | ORAL_TABLET | Freq: Four times a day (QID) | ORAL | 0 refills | Status: AC | PRN

## 2022-01-24 MED ORDER — ONDANSETRON HCL 4 MG/2ML IJ SOLN: 4.0000 mg | Freq: Once | INTRAMUSCULAR | Status: DC | PRN

## 2022-01-24 SURGICAL SUPPLY — 40 items
BAG COUNTER SPONGE SURGICOUNT (BAG) ×2 IMPLANT
BNDG COHESIVE 1X5 TAN STRL LF (GAUZE/BANDAGES/DRESSINGS) ×2 IMPLANT
BNDG CONFORM 3 STRL LF (GAUZE/BANDAGES/DRESSINGS) ×1 IMPLANT
BNDG ELASTIC 2X5.8 VLCR STR LF (GAUZE/BANDAGES/DRESSINGS) ×2 IMPLANT
BNDG ELASTIC 3X5.8 VLCR STR LF (GAUZE/BANDAGES/DRESSINGS) IMPLANT
BNDG ELASTIC 4X5.8 VLCR STR LF (GAUZE/BANDAGES/DRESSINGS) IMPLANT
BNDG ESMARK 4X9 LF (GAUZE/BANDAGES/DRESSINGS) ×1 IMPLANT
CORD BIPOLAR FORCEPS 12FT (ELECTRODE) ×2 IMPLANT
COVER SURGICAL LIGHT HANDLE (MISCELLANEOUS) ×2 IMPLANT
CUFF TOURN SGL QUICK 18X4 (TOURNIQUET CUFF) ×2 IMPLANT
CUFF TOURN SGL QUICK 24 (TOURNIQUET CUFF)
CUFF TRNQT CYL 24X4X16.5-23 (TOURNIQUET CUFF) IMPLANT
DRAPE SURG 17X23 STRL (DRAPES) ×2 IMPLANT
DRSG XEROFORM 1X8 (GAUZE/BANDAGES/DRESSINGS) ×1 IMPLANT
GAUZE SPONGE 2X2 8PLY STRL LF (GAUZE/BANDAGES/DRESSINGS) IMPLANT
GAUZE SPONGE 4X4 12PLY STRL (GAUZE/BANDAGES/DRESSINGS) ×1 IMPLANT
GLOVE BIOGEL PI IND STRL 8.5 (GLOVE) ×1 IMPLANT
GLOVE BIOGEL PI INDICATOR 8.5 (GLOVE) ×1
GLOVE SURG ORTHO 8.0 STRL STRW (GLOVE) ×2 IMPLANT
GOWN STRL REUS W/ TWL LRG LVL3 (GOWN DISPOSABLE) ×2 IMPLANT
GOWN STRL REUS W/ TWL XL LVL3 (GOWN DISPOSABLE) ×1 IMPLANT
GOWN STRL REUS W/TWL LRG LVL3 (GOWN DISPOSABLE) ×2
GOWN STRL REUS W/TWL XL LVL3 (GOWN DISPOSABLE) ×1
KIT BASIN OR (CUSTOM PROCEDURE TRAY) ×2 IMPLANT
KIT TURNOVER KIT B (KITS) ×2 IMPLANT
MANIFOLD NEPTUNE II (INSTRUMENTS) ×2 IMPLANT
NDL HYPO 25GX1X1/2 BEV (NEEDLE) IMPLANT
NEEDLE HYPO 25GX1X1/2 BEV (NEEDLE) IMPLANT
NS IRRIG 1000ML POUR BTL (IV SOLUTION) ×2 IMPLANT
PACK ORTHO EXTREMITY (CUSTOM PROCEDURE TRAY) ×2 IMPLANT
PAD ARMBOARD 7.5X6 YLW CONV (MISCELLANEOUS) ×4 IMPLANT
SOAP 2 % CHG 4 OZ (WOUND CARE) ×2 IMPLANT
SPONGE GAUZE 2X2 STER 10/PKG (GAUZE/BANDAGES/DRESSINGS)
SUT MERSILENE 4 0 P 3 (SUTURE) IMPLANT
SUT PROLENE 4 0 PS 2 18 (SUTURE) IMPLANT
SUT VICRYL RAPIDE 4/0 PS 2 (SUTURE) ×1 IMPLANT
SYR CONTROL 10ML LL (SYRINGE) IMPLANT
TOWEL GREEN STERILE (TOWEL DISPOSABLE) ×2 IMPLANT
TOWEL GREEN STERILE FF (TOWEL DISPOSABLE) ×2 IMPLANT
TUBE CONNECTING 12X1/4 (SUCTIONS) IMPLANT

## 2022-01-24 NOTE — Progress Notes (Signed)
Pt I/O caths at home for neurogenic bladder. Pt states she emptied bladder at 12:00. Patient states she should be fine until after surgery, but asking if they will cath her in the OR. OR nurse notified about I/O cath and requested pt have I/O cath in OR in sterile environment.

## 2022-01-24 NOTE — Anesthesia Preprocedure Evaluation (Signed)
Anesthesia Evaluation  Patient identified by MRN, date of birth, ID band Patient awake    Reviewed: Allergy & Precautions, NPO status , Patient's Chart, lab work & pertinent test results  History of Anesthesia Complications Negative for: history of anesthetic complications  Airway Mallampati: II  TM Distance: >3 FB Neck ROM: Full    Dental  (+) Dental Advisory Given, Teeth Intact   Pulmonary neg pulmonary ROS,    Pulmonary exam normal        Cardiovascular negative cardio ROS Normal cardiovascular exam     Neuro/Psych negative neurological ROS     GI/Hepatic negative GI ROS, Neg liver ROS,   Endo/Other  negative endocrine ROS  Renal/GU negative Renal ROS Bladder dysfunction (Neurogenic bladder)      Musculoskeletal negative musculoskeletal ROS (+)   Abdominal   Peds  Hematology negative hematology ROS (+)   Anesthesia Other Findings   Reproductive/Obstetrics                             Anesthesia Physical Anesthesia Plan  ASA: 2  Anesthesia Plan: General   Post-op Pain Management: Tylenol PO (pre-op)* and Toradol IV (intra-op)*   Induction: Intravenous  PONV Risk Score and Plan: 3 and Ondansetron, Dexamethasone, Midazolam and Treatment may vary due to age or medical condition  Airway Management Planned: LMA  Additional Equipment: None  Intra-op Plan:   Post-operative Plan: Extubation in OR  Informed Consent: I have reviewed the patients History and Physical, chart, labs and discussed the procedure including the risks, benefits and alternatives for the proposed anesthesia with the patient or authorized representative who has indicated his/her understanding and acceptance.     Dental advisory given  Plan Discussed with:   Anesthesia Plan Comments:         Anesthesia Quick Evaluation

## 2022-01-24 NOTE — Anesthesia Procedure Notes (Signed)
Procedure Name: LMA Insertion Date/Time: 01/24/2022 5:32 PM  Performed by: De Nurse, CRNAPre-anesthesia Checklist: Patient identified, Emergency Drugs available, Suction available and Patient being monitored Patient Re-evaluated:Patient Re-evaluated prior to induction Oxygen Delivery Method: Circle System Utilized Preoxygenation: Pre-oxygenation with 100% oxygen Induction Type: IV induction Ventilation: Mask ventilation without difficulty LMA: LMA inserted LMA Size: 4.0 Number of attempts: 1 Placement Confirmation: positive ETCO2 Tube secured with: Tape Dental Injury: Teeth and Oropharynx as per pre-operative assessment

## 2022-01-24 NOTE — Transfer of Care (Signed)
Immediate Anesthesia Transfer of Care Note  Patient: Mary Rogers  Procedure(s) Performed: REVISION AMPUTATION LEFT MIDDLE FINGER (Left: Middle Finger)  Patient Location: PACU  Anesthesia Type:General  Level of Consciousness: drowsy and patient cooperative  Airway & Oxygen Therapy: Patient Spontanous Breathing  Post-op Assessment: Report given to RN  Post vital signs: Reviewed and stable  Last Vitals:  Vitals Value Taken Time  BP 126/72   Temp    Pulse 78   Resp 12   SpO2 98     Last Pain:  Vitals:   01/24/22 1349  TempSrc: Oral  PainSc:          Complications: No notable events documented.

## 2022-01-24 NOTE — Op Note (Signed)
   Date of Surgery: 01/24/2022  INDICATIONS: Patient is a 56 y.o.-year-old female with a circular saw injury to the left middle finger tip with near circumferential laceration at the DIP joint with severe comminution of the distal phalangeal base with destruction of the articular surface.  Risks, benefits, and alternatives to surgery were again discussed with the patient in the preoperative area. The patient wishes to proceed with surgery.  Informed consent was signed after our discussion.   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  PROCEDURE:  Revision amputation of left middle finger throughout middle phalanx.    SURGEON: Audria Nine, M.D.  ASSIST:   ANESTHESIA:  Local, MAC  IV FLUIDS AND URINE: See anesthesia.  ESTIMATED BLOOD LOSS: <5 mL.  IMPLANTS: * No implants in log *   DRAINS: None  COMPLICATIONS: None  DESCRIPTION OF PROCEDURE: The patient was met in the preoperative holding area where the surgical site was marked and the consent form was verified.  The patient was then taken to the operating room and transferred to the operating table.  All bony prominences were well padded.  A tourniquet was applied to the left forearm.  General endotracheal anesthesia was induced.  The operative extremity was prepped and draped in the usual and sterile fashion.  A formal time-out was performed to confirm that this was the correct patient, surgery, side, and site.   Following timeout, the limb was exsanguinated with an Esmarch bandage and the tourniquet inflated to 250 mmHg.  The sutures were removed from the adjacent fingers as well as from this middle fingertip.  A fishmouth type design incision was designed incorporating the volar and dorsal aspects of the traumatic wound.  The skin was incised.  The extensor apparatus was incised dorsally.  There was a previous laceration of the FDP  tendon.  The finger was disarticulated at the DIP joint.  The distal aspect of the middle phalanx was rongeured to allow for a tension-free closure.  The radial and ulnar neurovascular bundles were identified.  The digital nerves were identified and traction neurectomies were performed.  The wound was then thoroughly irrigated with copious sterile saline.  The tourniquet was let down.  Hemostasis was achieved with the bipolar cautery.  The skin was closed using 4-0 Vicryl repeat sutures in a simple fashion.  The wound was dressed with bacitracin, Xeroform, 4 x 4's, Kling wrap, and a loose 1 inch Coban.  A digital block was performed using quarter percent plain Marcaine.  The patient was then reversed of anesthesia and extubated uneventfully.  She was transferred to the postoperative bed.  All counts were correct x2 the end the procedure.  She was then taken to PACU in stable condition.  POSTOPERATIVE PLAN: We will be discharged home with appropriate pain medication and discharge instructions.  I will see her back in 10 to 14 days for first postop visit.  Audria Nine, MD 6:20 PM

## 2022-01-24 NOTE — Interval H&P Note (Signed)
History and Physical Interval Note:  01/24/2022 5:02 PM  Mary Rogers  has presented today for surgery, with the diagnosis of LEFT MIDDLE FINGER LACERATION.  The various methods of treatment have been discussed with the patient and family. After consideration of risks, benefits and other options for treatment, the patient has consented to  Procedure(s): REVISION AMPUTATION LEFT MIDDLE FINGER (Left) as a surgical intervention.  The patient's history has been reviewed, patient examined, no change in status, stable for surgery.  I have reviewed the patient's chart and labs.  Questions were answered to the patient's satisfaction.     Shakeera Rightmyer Ailed Defibaugh

## 2022-01-24 NOTE — Discharge Instructions (Signed)
  Peyton Rossner, M.D. Hand Surgery  POST-OPERATIVE DISCHARGE INSTRUCTIONS   PRESCRIPTIONS: You may have been given a prescription to be taken as directed for post-operative pain control.  You may also take over the counter ibuprofen/aleve and tylenol for pain. Take this as directed on the packaging. Do not exceed 3000 mg tylenol/acetaminophen in 24 hours.  Ibuprofen 600-800 mg (3-4) tablets by mouth every 6 hours as needed for pain.  OR Aleve 2 tablets by mouth every 12 hours (twice daily) as needed for pain.  AND/OR Tylenol 1000 mg (2 tablets) every 8 hours as needed for pain.  Please use your pain medication carefully, as refills are limited and you may not be provided with one.  As stated above, please use over the counter pain medicine - it will also be helpful with decreasing your swelling.    ANESTHESIA: After your surgery, post-surgical discomfort or pain is likely. This discomfort can last several days to a few weeks. At certain times of the day your discomfort may be more intense.   Did you receive a nerve block?  A nerve block can provide pain relief for one hour to two days after your surgery. As long as the nerve block is working, you will experience little or no sensation in the area the surgeon operated on.  As the nerve block wears off, you will begin to experience pain or discomfort. It is very important that you begin taking your prescribed pain medication before the nerve block fully wears off. Treating your pain at the first sign of the block wearing off will ensure your pain is better controlled and more tolerable when full-sensation returns. Do not wait until the pain is intolerable, as the medicine will be less effective. It is better to treat pain in advance than to try and catch up.   General Anesthesia:  If you did not receive a nerve block during your surgery, you will need to start taking your pain medication shortly after your surgery and should continue  to do so as prescribed by your surgeon.     ICE AND ELEVATION: You may use ice for the first 48-72 hours, but it is not critical.   Motion of your fingers is very important to decrease the swelling.  Elevation, as much as possible for the next 48 hours, is critical for decreasing swelling as well as for pain relief. Elevation means when you are seated or lying down, you hand should be at or above your heart. When walking, the hand needs to be at or above the level of your elbow.  If the bandage gets too tight, it may need to be loosened. Please contact our office and we will instruct you in how to do this.    SURGICAL BANDAGES:  Keep your dressing and/or splint clean and dry at all times.  Do not remove until you are seen again in the office.  If careful, you may place a plastic bag over your bandage and tape the end to shower, but be careful, do not get your bandages wet.     HAND THERAPY:  You may not need any. If you do, we will begin this at your follow up visit in the clinic.    ACTIVITY AND WORK: You are encouraged to move any fingers which are not in the bandage.  Light use of the fingers is allowed to assist the other hand with daily hygiene and eating, but strong gripping or lifting is often uncomfortable and   should be avoided.  You might miss a variable period of time from work and hopefully this issue has been discussed prior to surgery. You may not do any heavy work with your affected hand for about 2 weeks.    [facility] [address] [place],  [place]  [address] [phone]  

## 2022-01-25 ENCOUNTER — Encounter (HOSPITAL_COMMUNITY): Payer: Self-pay | Admitting: Orthopedic Surgery

## 2022-01-26 NOTE — Anesthesia Postprocedure Evaluation (Signed)
Anesthesia Post Note  Patient: Mary Rogers  Procedure(s) Performed: REVISION AMPUTATION LEFT MIDDLE FINGER (Left: Middle Finger)     Patient location during evaluation: PACU Anesthesia Type: General Level of consciousness: awake and alert Pain management: pain level controlled Vital Signs Assessment: post-procedure vital signs reviewed and stable Respiratory status: spontaneous breathing, nonlabored ventilation, respiratory function stable and patient connected to nasal cannula oxygen Cardiovascular status: blood pressure returned to baseline and stable Postop Assessment: no apparent nausea or vomiting Anesthetic complications: no   No notable events documented.  Last Vitals:  Vitals:   01/24/22 1845 01/24/22 1900  BP: 129/79 118/67  Pulse: 66 67  Resp: 12 17  Temp:    SpO2: 92% 98%    Last Pain:  Vitals:   01/24/22 1900  TempSrc:   PainSc: 6                  Mckensey Berghuis

## 2022-01-28 ENCOUNTER — Telehealth: Payer: Self-pay | Admitting: Orthopedic Surgery

## 2022-02-02 NOTE — Progress Notes (Unsigned)
   I, Christoper Fabian, LAT, ATC, am serving as scribe for Dr. Clementeen Graham.  Mary Rogers is a 57 y.o. female who presents to [facility] today for f/u of L wrist pain due to a transverse fx of the distal radial fx w/ dorsal impaction that occurred on Saturday, 10/30/21, after suffering a fall, landing on an outstretched L hand.  She was last seen by Dr. Denyse Amass on 12/30/21 and noted improvement in her L forearm pain but con't to have ROM difficulty in her L hand, being unable to fully flex her hand.  She was referred to hand therapy in [place].  In the interim, she lacerated her L middle finger to the point that she later had a L middle finger distal phalanx amputation on 01/24/22 w/ Dr. Frazier Butt.  Today, pt reports  Diagnostic testing: L wrist XR- 12/03/21, 11/19/21, 11/16/21, 11/01/21  Pertinent review of systems: ***  Relevant historical information: ***   Exam:  There were no vitals taken for this visit. General: Well Developed, well nourished, and in no acute distress.   MSK: ***    Lab and Radiology Results No results found for this or any previous visit (from the past 72 hour(s)). No results found.     Assessment and Plan: 56 y.o. female with ***   PDMP not reviewed this encounter. No orders of the defined types were placed in this encounter.  No orders of the defined types were placed in this encounter.    Discussed warning signs or symptoms. Please see discharge instructions. Patient expresses understanding.   ***

## 2022-02-03 ENCOUNTER — Ambulatory Visit (INDEPENDENT_AMBULATORY_CARE_PROVIDER_SITE_OTHER): Payer: 59 | Admitting: Orthopedic Surgery

## 2022-02-03 ENCOUNTER — Ambulatory Visit (INDEPENDENT_AMBULATORY_CARE_PROVIDER_SITE_OTHER): Payer: 59 | Admitting: Family Medicine

## 2022-02-03 ENCOUNTER — Encounter: Payer: Self-pay | Admitting: Family Medicine

## 2022-02-03 ENCOUNTER — Ambulatory Visit (INDEPENDENT_AMBULATORY_CARE_PROVIDER_SITE_OTHER): Payer: 59

## 2022-02-03 VITALS — BP 120/80 | HR 62 | Ht 64.0 in | Wt 191.0 lb

## 2022-02-03 DIAGNOSIS — S68623A 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, initial encounter: Secondary | ICD-10-CM

## 2022-02-03 DIAGNOSIS — S52532D Colles' fracture of left radius, subsequent encounter for closed fracture with routine healing: Secondary | ICD-10-CM | POA: Diagnosis not present

## 2022-02-03 NOTE — Progress Notes (Signed)
   Post-Op Visit Note   Patient: Mary Rogers           Date of Birth: [birth year]           MRN: [id number] Visit Date: 02/03/2022 PCP: Pcp, No   Assessment & Plan:  Chief Complaint:  Chief Complaint  Patient presents with   Left Middle Finger - Routine Post Op   Visit Diagnoses:  1. Partial traumatic amputation of left middle finger through phalanx, initial encounter     Plan: Patient is 10 days s/p revision amputation of a left middle finger injury with a near complete amputation through the DIP joint.  She is doing well today.  Her finger tip is clean and dry with appropriate postop swelling.  There is no erythema.  She will see hand therapy in [place].  She can start washing the finger with warm soapy water.  Repeat x-rays of her left wrist demonstrate a healed distal radius fracture. She needs to start working on wrist ROM. I can see her back in another two weeks.    Follow-Up Instructions: No follow-ups on file.   Orders:  No orders of the defined types were placed in this encounter.  No orders of the defined types were placed in this encounter.   Imaging: No results found.  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  Past Surgical History:  Procedure Laterality Date   AMPUTATION Left 01/24/2022   Procedure: REVISION AMPUTATION LEFT MIDDLE FINGER;  Surgeon: Marlyne Beards, MD;  Location: [facility] OR;  Service: Orthopedics;  Laterality: Left;   CESAREAN SECTION     CHOLECYSTECTOMY     COLON SURGERY      Social History   Occupational History   Not on file  Tobacco Use   Smoking status: Never   Smokeless tobacco: Never  Vaping Use   Vaping Use: Never used  Substance and Sexual Activity   Alcohol use: Not Currently    Comment: rare   Drug use: Never    Comment: pt takes hemp to help sleep at night   Sexual activity: Not on file

## 2022-02-03 NOTE — Patient Instructions (Addendum)
Good to see you today.  Thumb loop wrist brace  Please get an Xray today before you leave   Proceed w/ hand therapy once you get the ok from Dr. Frazier Butt.  Follow-up: 1 month.

## 2022-02-04 NOTE — Progress Notes (Signed)
Left wrist x-ray is stable.  It is healing.

## 2022-02-11 ENCOUNTER — Encounter: Payer: Self-pay | Admitting: Family Medicine

## 2022-02-11 ENCOUNTER — Encounter: Payer: Self-pay | Admitting: Radiology

## 2022-02-11 ENCOUNTER — Telehealth: Payer: Self-pay | Admitting: Orthopedic Surgery

## 2022-02-11 NOTE — Telephone Encounter (Signed)
I tried to call but got message that call could not be completed at this time, I did a note to keep her out till we see her on 02/17/22. This is all I can do since Dr. Frazier Butt is out of the office

## 2022-02-11 NOTE — Telephone Encounter (Signed)
Pt called requesting an updated work note to say out of work for additional month. Pt states physical therapy recommend another month out. Please sen to pt's mychart. Pt phone number is [phone]

## 2022-02-12 ENCOUNTER — Encounter: Payer: Self-pay | Admitting: Family Medicine

## 2022-02-14 ENCOUNTER — Encounter: Payer: Self-pay | Admitting: Family Medicine

## 2022-02-15 ENCOUNTER — Telehealth: Payer: Self-pay | Admitting: Orthopedic Surgery

## 2022-02-15 ENCOUNTER — Encounter: Payer: Self-pay | Admitting: Radiology

## 2022-02-15 NOTE — Telephone Encounter (Signed)
Note sent to her mychart

## 2022-02-15 NOTE — Telephone Encounter (Signed)
Pt called stating that her job stated they did not receive return to work note. Pt is asking for the note to be revised to return to work 03/08/22 and sent to her mychart. Pt phone number is [phone]

## 2022-02-16 ENCOUNTER — Encounter: Payer: Self-pay | Admitting: Family Medicine

## 2022-02-16 DIAGNOSIS — S52532D Colles' fracture of left radius, subsequent encounter for closed fracture with routine healing: Secondary | ICD-10-CM

## 2022-02-16 DIAGNOSIS — M25532 Pain in left wrist: Secondary | ICD-10-CM

## 2022-02-17 ENCOUNTER — Ambulatory Visit (INDEPENDENT_AMBULATORY_CARE_PROVIDER_SITE_OTHER): Payer: 59 | Admitting: Orthopedic Surgery

## 2022-02-17 DIAGNOSIS — S68623A 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, initial encounter: Secondary | ICD-10-CM

## 2022-02-17 DIAGNOSIS — S52532D Colles' fracture of left radius, subsequent encounter for closed fracture with routine healing: Secondary | ICD-10-CM

## 2022-02-17 NOTE — Progress Notes (Signed)
   Post-Op Visit Note   Patient: Mary Rogers           Date of Birth: Sep 09, 1965           MRN: [id number] Visit Date: 02/17/2022 PCP: Pcp, No   Assessment & Plan:  Chief Complaint:  Chief Complaint  Patient presents with   Left Middle Finger - Routine Post Op   Visit Diagnoses:  1. Partial traumatic amputation of left middle finger through phalanx, initial encounter   2. Closed Colles' fracture of left radius with routine healing, subsequent encounter     Plan: Patient is 3 and half weeks status post revision amputation of left middle finger for near complete amputation.  She doing very well today.  Her amputation site is clean, dry, and well-healed.  She has not done therapy as she has had some trouble finding a therapist that takes her insurance.  She is recently changed insurance carriers and is looking for a new referral.  She has minimal pain at the tip of the middle finger.  She is anxious to return to work.  I will place a new referral for hand therapy and see her back in another month.  Follow-Up Instructions: No follow-ups on file.   Orders:  No orders of the defined types were placed in this encounter.  No orders of the defined types were placed in this encounter.   Imaging: No results found.  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  Past Surgical History:  Procedure Laterality Date   AMPUTATION Left 01/24/2022   Procedure: REVISION AMPUTATION LEFT MIDDLE FINGER;  Surgeon: Marlyne Beards,  MD;  Location: [facility] OR;  Service: Orthopedics;  Laterality: Left;   CESAREAN SECTION     CHOLECYSTECTOMY     COLON SURGERY     Social History   Occupational History   Not on file  Tobacco Use   Smoking status: Never   Smokeless tobacco: Never  Vaping Use   Vaping Use: Never used  Substance and Sexual Activity   Alcohol use: Not Currently    Comment: rare   Drug use: Never    Comment: pt takes hemp to help sleep at night   Sexual activity: Not on file

## 2022-02-25 ENCOUNTER — Encounter: Payer: Self-pay | Admitting: Family Medicine

## 2022-03-04 ENCOUNTER — Encounter: Payer: Self-pay | Admitting: Physical Therapy

## 2022-03-04 ENCOUNTER — Other Ambulatory Visit: Payer: Self-pay

## 2022-03-04 ENCOUNTER — Ambulatory Visit (INDEPENDENT_AMBULATORY_CARE_PROVIDER_SITE_OTHER): Payer: 59 | Admitting: Physical Therapy

## 2022-03-04 DIAGNOSIS — M6281 Muscle weakness (generalized): Secondary | ICD-10-CM

## 2022-03-04 DIAGNOSIS — M25632 Stiffness of left wrist, not elsewhere classified: Secondary | ICD-10-CM

## 2022-03-04 DIAGNOSIS — M25532 Pain in left wrist: Secondary | ICD-10-CM

## 2022-03-04 DIAGNOSIS — M25542 Pain in joints of left hand: Secondary | ICD-10-CM

## 2022-03-04 DIAGNOSIS — M25642 Stiffness of left hand, not elsewhere classified: Secondary | ICD-10-CM

## 2022-03-04 DIAGNOSIS — M79645 Pain in left finger(s): Secondary | ICD-10-CM

## 2022-03-04 NOTE — Therapy (Signed)
OUTPATIENT PHYSICAL THERAPY UPPER EXTREMITY EVALUATION   Patient Name: Mary Rogers MRN: [id number] DOB:[birth year], 56 y.o., female Today's Date: 03/04/2022   PT End of Session - 03/04/22 0841     Visit Number 1    Number of Visits 6    Date for PT Re-Evaluation 04/15/22    Authorization Type [employer] $70 copay    PT Start Time 0802    PT Stop Time 0840    PT Time Calculation (min) 38 min    Activity Tolerance Patient tolerated treatment well    Behavior During Therapy [facility] for tasks assessed/performed             Past Medical History:  Diagnosis Date   Diverticulitis    Fatty liver    Neurogenic bladder    Past Surgical History:  Procedure Laterality Date   AMPUTATION Left 01/24/2022   Procedure: REVISION AMPUTATION LEFT MIDDLE FINGER;  Surgeon: Marlyne Beards, MD;  Location: [facility] OR;  Service: Orthopedics;  Laterality: Left;   CESAREAN SECTION     CHOLECYSTECTOMY     COLON SURGERY     Patient Active Problem List   Diagnosis Date Noted   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    Open displaced fracture of distal phalanx of finger of left hand 01/14/2022   Laceration of left middle finger w/o foreign body w/o damage to nail 01/14/2022   Laceration of left index finger 01/07/2022   Fracture, Colles, left, closed 11/16/2021    PCP: No PCP  REFERRING PROVIDER: Rodolph Bong, MD   REFERRING DIAG: S52.532D (ICD-10-CM) - Closed Colles' fracture of left radius with routine healing, subsequent encounter M25.532 (ICD-10-CM) - Left wrist pain   THERAPY DIAG:  Pain in left wrist - Plan: PT plan of care cert/re-cert  Stiffness of left wrist, not elsewhere classified - Plan: PT plan of care cert/re-cert  Pain in joints of left hand - Plan: PT plan of care cert/re-cert  Stiffness of left hand, not elsewhere classified - Plan: PT plan of care cert/re-cert  Pain in left finger(s) - Plan: PT plan of care cert/re-cert  Muscle weakness  (generalized) - Plan: PT plan of care cert/re-cert  Rationale for Evaluation and Treatment Rehabilitation  ONSET DATE: 10/30/21  SUBJECTIVE:                                                                                                                                                                                      SUBJECTIVE STATEMENT: Pt is Rt hand dominant female who presents to OPPT s/p Lt colles fx on 10/30/21 with slow healing.  Fx was reduced in ED and did  not have surgery.  She then had complication on 01/06/22 had traumatic injury of Lt middle finger resulting in Lt DIP amputation.  PERTINENT HISTORY: Neurogenic Bladder, 01/06/22 hand injury with circular saw resulting in partial amputation of Lt middle finger, osteopenia  PAIN:  Are you having pain?  Only when flexing middle finger and end ranges of wrist  PRECAUTIONS: None  WEIGHT BEARING RESTRICTIONS No  FALLS:  Has patient fallen in last 6 months? Yes. Number of falls 1  LIVING ENVIRONMENT: Lives with: lives with their spouse  OCCUPATION: Full time: drives a fork lift  PLOF: Independent and Leisure: gardening  PATIENT GOALS improve mobility  OBJECTIVE:   PATIENT SURVEYS:  03/04/22: FOTO 46 (predicted 67)  COGNITION: Overall cognitive status: Within functional limits for tasks assessed     SENSATION: WFL  POSTURE: Rounded shoulders, forward head  UPPER EXTREMITY ROM:   Active ROM Right eval Left eval  Elbow extension  -5  Wrist flexion  27  Wrist extension  35  Wrist ulnar deviation  13  Wrist radial deviation  5  Wrist pronation  70  Wrist supination  90  (Blank rows = not tested)   Passive ROM Right eval Left eval  Wrist flexion  30  Wrist extension  50  Wrist ulnar deviation  16  Wrist radial deviation  12  (Blank rows = not tested)  UPPER EXTREMITY MMT: Lt wrist 3-/5  JOINT MOBILITY TESTING:  03/04/22: hypomobility throughout wrist, hand and fingers on Lt    TODAY'S TREATMENT:   03/04/22: See HEP - performed and demonstrated with trial reps PRN for comprehension   PATIENT EDUCATION: Education details: HEP Person educated: Patient Education method: Explanation, Demonstration, and Handouts Education comprehension: verbalized understanding, returned demonstration, and needs further education   HOME EXERCISE PROGRAM: Access Code: [email or web address] URL: [email or web address] Date: 03/04/2022 Prepared by: Moshe Cipro  Exercises - Seated Wrist Flexion Extension PROM  - 2-3 x daily - 7 x weekly - 1 sets - 5-10 reps - 10-20 sec hold - Seated Wrist Prayer Stretch  - 2-3 x daily - 7 x weekly - 1 sets - 3 reps - 30 sec hold - Seated Reverse Wrist Prayer Stretch  - 2-3 x daily - 7 x weekly - 1 sets - 3 reps - 30 sec hold - Putty Squeezes  - 1 x daily - 7 x weekly - 1 sets - 10 reps - 5 sec hold - Seated Forearm Pronation and Supination AROM  - 1 x daily - 7 x weekly - 1 sets - 10 reps - 10-15 sec hold - Seated Finger DIP AROM  - 1 x daily - 7 x weekly - 2-3 sets - 10 reps - Seated Finger PIP AROM  - 1 x daily - 7 x weekly - 2-3 sets - 10 reps - Wrist Circumduction AROM  - 1 x daily - 7 x weekly - 2-3 sets - 10 reps  ASSESSMENT:  CLINICAL IMPRESSION: Patient is a 56 y.o. female who was seen today for physical therapy evaluation and treatment for Lt distal radius fx on 10/30/21.  She demonstrates significant ROM limitations, pain, and decreased strength affecting functional activities.  She will benefit from PT to address deficits listed.     OBJECTIVE IMPAIRMENTS decreased coordination, decreased ROM, decreased strength, hypomobility, increased edema, increased fascial restrictions, increased muscle spasms, impaired flexibility, impaired UE functional use, and pain.   ACTIVITY LIMITATIONS carrying, lifting, bathing, toileting, dressing, self feeding, and hygiene/grooming  PARTICIPATION LIMITATIONS: meal  prep, cleaning, laundry, shopping, community  activity, occupation, and yard work  PERSONAL FACTORS Time since onset of injury/illness/exacerbation and 3+ comorbidities: Neurogenic Bladder, 01/06/22 hand injury with circular saw resulting in partial amputation of Lt middle finger, osteopenia  are also affecting patient's functional outcome.   REHAB POTENTIAL: Good  CLINICAL DECISION MAKING: Evolving/moderate complexity  EVALUATION COMPLEXITY: Moderate  GOALS: Goals reviewed with patient? Yes  SHORT TERM GOALS: Target date: 04/01/2022  Independent with initial HEP Goal status: INITIAL  2.  Lt wrist AROM improved at least 5 deg all limited directions for improved function Goal status: INITIAL   LONG TERM GOALS: Target date: 04/29/2022  Independent with final HEP Goal status: INITIAL  2.  FOTO score improved to 67 Goal status: INITIAL  3.  Lt wrist AROM flexion/extension improved by 10 deg for improved ADLs Goal status: INIITAL  4.  Report pain < 3/10 with ADLs and work simulated tasks for improved function Goal status: INITIAL  5.  Demonstrate ability to create full fist with Lt hand for improved ROM and function Goal status: INITIAL   PLAN: PT FREQUENCY: 1x/week  PT DURATION: 8 weeks  PLANNED INTERVENTIONS: Therapeutic exercises, Therapeutic activity, Neuromuscular re-education, Balance training, Patient/Family education, Self Care, Joint mobilization, Dry Needling, Electrical stimulation, Cryotherapy, Moist heat, Taping, Ultrasound, Manual therapy, and Re-evaluation  PLAN FOR NEXT SESSION: review HEP, manual therapy for ROM as tolerate, light grip strengthening   Laureen Abrahams, PT, DPT 03/04/22 11:42 AM

## 2022-03-07 ENCOUNTER — Other Ambulatory Visit: Payer: Self-pay

## 2022-03-07 ENCOUNTER — Ambulatory Visit (INDEPENDENT_AMBULATORY_CARE_PROVIDER_SITE_OTHER): Payer: 59 | Admitting: Family Medicine

## 2022-03-07 ENCOUNTER — Ambulatory Visit (INDEPENDENT_AMBULATORY_CARE_PROVIDER_SITE_OTHER)
Admission: RE | Admit: 2022-03-07 | Discharge: 2022-03-07 | Disposition: A | Discharge status: Home / Self Care | Payer: 59 | Source: Ambulatory Visit | Attending: Family Medicine | Admitting: Family Medicine

## 2022-03-07 VITALS — BP 130/84 | HR 67 | Ht 64.0 in | Wt 190.0 lb

## 2022-03-07 DIAGNOSIS — S52532D Colles' fracture of left radius, subsequent encounter for closed fracture with routine healing: Secondary | ICD-10-CM

## 2022-03-07 NOTE — Progress Notes (Signed)
   I, Mary Rogers, LAT, ATC acting as a scribe for Mary Graham, MD.  Mary Rogers is a 56 y.o. female who presents to [facility] today for  f/u of L wrist pain due to a transverse fx of the distal radial fx w/ dorsal impaction that occurred on Saturday, 10/30/21, after suffering a fall, landing on an outstretched L hand.  Of note, she lacerated her L middle finger to the point that she later had a L middle finger distal phalanx amputation on 01/24/22 w/ Dr. Frazier Rogers. She was last seen by Dr. Denyse Rogers on 02/03/22 and was re-referred to hand therapy, completing 1 visit. Today, pt reports decreased AROM and pain w/ pressure and carrying things w/ her L arm.   Dx imaging: 02/03/22 L wrist XR  01/14/22 L wrist XR  12/30/21 L wrist XR & Bone density scan  12/03/21 L wrist XR  11/26/21 L wrist XR  11/19/21 L wrist XR  11/16/21 L wrist XR  11/01/21 L hand XR  Pertinent review of systems: No fevers or chills  Relevant historical information: Recent distal left finger amputation   Exam:  BP 130/84   Pulse 67   Ht 5\' 4"  (1.626 m)   Wt 190 lb (86.2 kg)   SpO2 97%   BMI 32.61 kg/m  General: Well Developed, well nourished, and in no acute distress.   MSK: Left wrist mild effusion decreased range of motion.    Lab and Radiology Results  X-ray images left wrist obtained today personally and independently interpreted. Fracture line at distal radius has healed per my read. Await radiology over read.     Assessment and Plan: 56 y.o. female with wrist fracture.  The fracture has healed at least according to my read.  Radiology overread is still pending.  She still has considerable wrist stiffness and will benefit from hand therapy.  Hand therapy is just starting for her wrist and the partial finger amputation that she recently had.  She is  scheduled to return to work on September 1 which probably is realistic.  Recommend recheck with me in about a month which should be  the end of August which should be a good time to reassess return to work status.  We may consider an injection at that time into the wrist if needed.    Discussed warning signs or symptoms. Please see discharge instructions. Patient expresses understanding.   The above documentation has been reviewed and is accurate and complete September, M.D.

## 2022-03-07 NOTE — Patient Instructions (Addendum)
Thank you for coming in today.   Continue hand therapy

## 2022-03-08 NOTE — Progress Notes (Signed)
Fracture is healing.

## 2022-03-10 ENCOUNTER — Encounter: Payer: Self-pay | Admitting: Physical Therapy

## 2022-03-10 ENCOUNTER — Ambulatory Visit (INDEPENDENT_AMBULATORY_CARE_PROVIDER_SITE_OTHER): Payer: Commercial Managed Care - HMO | Admitting: Physical Therapy

## 2022-03-10 DIAGNOSIS — M25642 Stiffness of left hand, not elsewhere classified: Secondary | ICD-10-CM | POA: Diagnosis not present

## 2022-03-10 DIAGNOSIS — M25542 Pain in joints of left hand: Secondary | ICD-10-CM | POA: Diagnosis not present

## 2022-03-10 DIAGNOSIS — M25632 Stiffness of left wrist, not elsewhere classified: Secondary | ICD-10-CM

## 2022-03-10 DIAGNOSIS — M6281 Muscle weakness (generalized): Secondary | ICD-10-CM

## 2022-03-10 DIAGNOSIS — M79645 Pain in left finger(s): Secondary | ICD-10-CM

## 2022-03-10 DIAGNOSIS — M25532 Pain in left wrist: Secondary | ICD-10-CM | POA: Diagnosis not present

## 2022-03-10 NOTE — Therapy (Signed)
OUTPATIENT PHYSICAL THERAPY TREATMENT NOTE   Patient Name: Mary Rogers MRN: [id number] DOB:Sep 02, 1965, 56 y.o., female Today's Date: 03/10/2022   END OF SESSION:   PT End of Session - 03/10/22 0806     Visit Number 2    Number of Visits 6    Date for PT Re-Evaluation 04/15/22    Authorization Type [employer] $70 copay    PT Start Time 0805    PT Stop Time 0845    PT Time Calculation (min) 40 min    Activity Tolerance Patient tolerated treatment well    Behavior During Therapy [facility] for tasks assessed/performed             Past Medical History:  Diagnosis Date   Diverticulitis    Fatty liver    Neurogenic bladder    Past Surgical History:  Procedure Laterality Date   AMPUTATION Left 01/24/2022   Procedure: REVISION AMPUTATION LEFT MIDDLE FINGER;  Surgeon: Marlyne Beards, MD;  Location: [facility] OR;  Service: Orthopedics;  Laterality: Left;   CESAREAN SECTION     CHOLECYSTECTOMY     COLON SURGERY     Patient Active Problem List   Diagnosis Date Noted   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    Open displaced fracture of distal phalanx of finger of left hand 01/14/2022   Laceration of left middle finger w/o foreign body w/o damage to nail 01/14/2022   Laceration of left index finger 01/07/2022   Fracture, Colles, left, closed 11/16/2021     THERAPY DIAG:  Pain in left wrist  Stiffness of left wrist, not elsewhere classified  Pain in joints of left hand  Stiffness of left hand, not elsewhere classified  Pain in left finger(s)  Muscle weakness (generalized)  PCP: No PCP  REFERRING PROVIDER: Rodolph Bong, MD   REFERRING DIAG: S52.532D (ICD-10-CM) - Closed Colles' fracture of left radius with routine healing, subsequent encounter M25.532 (ICD-10-CM) - Left wrist pain   Rationale for Evaluation and Treatment Rehabilitation  ONSET DATE: 10/30/21  SUBJECTIVE:                                                                                                                                                                                       SUBJECTIVE STATEMENT: Doing well, has been working on HEP as able but reports time is limited.    PERTINENT HISTORY: Neurogenic Bladder, 01/06/22 hand injury with circular saw resulting in partial amputation of Lt middle finger, osteopenia  PAIN:  Are you having pain? Only when flexing middle finger and end ranges of wrist  PRECAUTIONS: None  PATIENT GOALS improve mobility  OBJECTIVE:  PATIENT SURVEYS:  03/04/22: FOTO 46 (predicted 67)  UPPER EXTREMITY ROM:   Active ROM Right eval Left eval Left 03/10/22  Elbow extension  -5   Wrist flexion  27 42  Wrist extension  35 45  Wrist ulnar deviation  13   Wrist radial deviation  5   Wrist pronation  70   Wrist supination  90   (Blank rows = not tested)   Passive ROM Right eval Left eval  Wrist flexion  30  Wrist extension  50  Wrist ulnar deviation  16  Wrist radial deviation  12  (Blank rows = not tested)  UPPER EXTREMITY MMT: Lt wrist 3-/5  JOINT MOBILITY TESTING:  03/04/22: hypomobility throughout wrist, hand and fingers on Lt    TODAY'S TREATMENT:  03/10/22 Therex: Prayer stretch 3x20 sec; reverse prayer stretch 3x20 sec Wrist flexion/extensionx 2 min with red therabar Red digitizer x 2 min Velcro board wrist pronation/supination x 1 rep - increased difficulty with supination   Manual STM to Lt hand/forearm PROM to Lt wrist/hand/forearm to tolerance all motions Gr 2-3 MCP, carpals and radius/ulna mobs  03/04/22: See HEP - performed and demonstrated with trial reps PRN for comprehension   PATIENT EDUCATION: Education details: HEP Person educated: Patient Education method: Programmer, multimedia, Demonstration, and Handouts Education comprehension: verbalized understanding, returned demonstration, and needs further education   HOME EXERCISE PROGRAM: Access Code: [email or web address] URL:  [email or web address] Date: 03/04/2022 Prepared by: Moshe Cipro  Exercises - Seated Wrist Flexion Extension PROM  - 2-3 x daily - 7 x weekly - 1 sets - 5-10 reps - 10-20 sec hold - Seated Wrist Prayer Stretch  - 2-3 x daily - 7 x weekly - 1 sets - 3 reps - 30 sec hold - Seated Reverse Wrist Prayer Stretch  - 2-3 x daily - 7 x weekly - 1 sets - 3 reps - 30 sec hold - Putty Squeezes  - 1 x daily - 7 x weekly - 1 sets - 10 reps - 5 sec hold - Seated Forearm Pronation and Supination AROM  - 1 x daily - 7 x weekly - 1 sets - 10 reps - 10-15 sec hold - Seated Finger DIP AROM  - 1 x daily - 7 x weekly - 2-3 sets - 10 reps - Seated Finger PIP AROM  - 1 x daily - 7 x weekly - 2-3 sets - 10 reps - Wrist Circumduction AROM  - 1 x daily - 7 x weekly - 2-3 sets - 10 reps  ASSESSMENT:  CLINICAL IMPRESSION: Pt with slight improvement in flexion/extension today for ROM, and continued to work on strengthening and maximizing motion.  Will continue to benefit from PT to maximize function.  OBJECTIVE IMPAIRMENTS decreased coordination, decreased ROM, decreased strength, hypomobility, increased edema, increased fascial restrictions, increased muscle spasms, impaired flexibility, impaired UE functional use, and pain.   ACTIVITY LIMITATIONS carrying, lifting, bathing, toileting, dressing, self feeding, and hygiene/grooming  PARTICIPATION LIMITATIONS: meal prep, cleaning, laundry, shopping, community activity, occupation, and yard work  PERSONAL FACTORS Time since onset of injury/illness/exacerbation and 3+ comorbidities: Neurogenic Bladder, 01/06/22 hand injury with circular saw resulting in partial amputation of Lt middle finger, osteopenia are also affecting patient's functional outcome.   REHAB POTENTIAL: Good  CLINICAL DECISION MAKING: Evolving/moderate complexity  EVALUATION COMPLEXITY: Moderate  GOALS: Goals reviewed with patient? Yes  SHORT TERM GOALS: Target date:  04/01/2022  Independent with initial HEP Goal status: INITIAL  2.  Lt wrist AROM improved at least 5  deg all limited directions for improved function Goal status: INITIAL   LONG TERM GOALS: Target date: 04/29/2022  Independent with final HEP Goal status: INITIAL  2.  FOTO score improved to 67 Goal status: INITIAL  3.  Lt wrist AROM flexion/extension improved by 10 deg for improved ADLs Goal status: INIITAL  4.  Report pain < 3/10 with ADLs and work simulated tasks for improved function Goal status: INITIAL  5.  Demonstrate ability to create full fist with Lt hand for improved ROM and function Goal status: INITIAL   PLAN: PT FREQUENCY: 1x/week  PT DURATION: 8 weeks  PLANNED INTERVENTIONS: Therapeutic exercises, Therapeutic activity, Neuromuscular re-education, Balance training, Patient/Family education, Self Care, Joint mobilization, Dry Needling, Electrical stimulation, Cryotherapy, Moist heat, Taping, Ultrasound, Manual therapy, and Re-evaluation  PLAN FOR NEXT SESSION: continue manual for ROM as tolerated, light grip strengthening, UE strengthening     Clarita Crane, PT, DPT 03/10/22 8:55 AM

## 2022-03-17 ENCOUNTER — Encounter: Payer: Self-pay | Admitting: Physical Therapy

## 2022-03-17 ENCOUNTER — Ambulatory Visit: Payer: 59 | Admitting: Orthopedic Surgery

## 2022-03-17 ENCOUNTER — Ambulatory Visit (INDEPENDENT_AMBULATORY_CARE_PROVIDER_SITE_OTHER): Payer: Commercial Managed Care - HMO | Admitting: Physical Therapy

## 2022-03-17 DIAGNOSIS — M6281 Muscle weakness (generalized): Secondary | ICD-10-CM

## 2022-03-17 DIAGNOSIS — M25632 Stiffness of left wrist, not elsewhere classified: Secondary | ICD-10-CM | POA: Diagnosis not present

## 2022-03-17 DIAGNOSIS — M25532 Pain in left wrist: Secondary | ICD-10-CM | POA: Diagnosis not present

## 2022-03-17 DIAGNOSIS — M25642 Stiffness of left hand, not elsewhere classified: Secondary | ICD-10-CM

## 2022-03-17 DIAGNOSIS — M79645 Pain in left finger(s): Secondary | ICD-10-CM

## 2022-03-17 DIAGNOSIS — M25542 Pain in joints of left hand: Secondary | ICD-10-CM

## 2022-03-17 NOTE — Therapy (Signed)
OUTPATIENT PHYSICAL THERAPY TREATMENT NOTE   Patient Name: Mary Rogers MRN: [id number] DOB:1966/04/12, 56 y.o., female Today's Date: 03/17/2022   END OF SESSION:   PT End of Session - 03/17/22 0905     Visit Number 3    Number of Visits 6    Date for PT Re-Evaluation 04/15/22    Authorization Type [employer] $70 copay    PT Start Time 0845    PT Stop Time 0927    PT Time Calculation (min) 42 min    Activity Tolerance Patient tolerated treatment well    Behavior During Therapy [facility] for tasks assessed/performed              Past Medical History:  Diagnosis Date   Diverticulitis    Fatty liver    Neurogenic bladder    Past Surgical History:  Procedure Laterality Date   AMPUTATION Left 01/24/2022   Procedure: REVISION AMPUTATION LEFT MIDDLE FINGER;  Surgeon: Sherilyn Cooter, MD;  Location: [place];  Service: Orthopedics;  Laterality: Left;   CESAREAN SECTION     CHOLECYSTECTOMY     COLON SURGERY     Patient Active Problem List   Diagnosis Date Noted   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    Open displaced fracture of distal phalanx of finger of left hand 01/14/2022   Laceration of left middle finger w/o foreign body w/o damage to nail 01/14/2022   Laceration of left index finger 01/07/2022   Fracture, Colles, left, closed 11/16/2021     THERAPY DIAG:  Pain in left wrist  Stiffness of left wrist, not elsewhere classified  Pain in joints of left hand  Pain in left finger(s)  Stiffness of left hand, not elsewhere classified  Muscle weakness (generalized)  PCP: No PCP  REFERRING PROVIDER: Gregor Hams, MD   REFERRING DIAG: S52.532D (ICD-10-CM) - Closed Colles' fracture of left radius with routine healing, subsequent encounter M25.532 (ICD-10-CM) - Left wrist pain   Rationale for Evaluation and Treatment Rehabilitation  ONSET DATE: 10/30/21  SUBJECTIVE:                                                                                                                                                                                       SUBJECTIVE STATEMENT: Feels like she's getting a little more motion  PERTINENT HISTORY: Neurogenic Bladder, 01/06/22 hand injury with circular saw resulting in partial amputation of Lt middle finger, osteopenia  PAIN:  Are you having pain? Only when flexing middle finger and end ranges of wrist  PRECAUTIONS: None  PATIENT GOALS improve mobility  OBJECTIVE:   PATIENT SURVEYS:  03/04/22: FOTO 46 (  predicted 67)  UPPER EXTREMITY ROM:   Active ROM Right eval Left eval Left 03/10/22  Elbow extension  -5   Wrist flexion  27 42  Wrist extension  35 45  Wrist ulnar deviation  13   Wrist radial deviation  5   Wrist pronation  70   Wrist supination  90   (Blank rows = not tested)   Passive ROM Right eval Left eval  Wrist flexion  30  Wrist extension  50  Wrist ulnar deviation  16  Wrist radial deviation  12  (Blank rows = not tested)  UPPER EXTREMITY MMT: Lt wrist 3-/5  JOINT MOBILITY TESTING:  03/04/22: hypomobility throughout wrist, hand and fingers on Lt    TODAY'S TREATMENT:  03/17/22 Manual STM to Lt hand/forearm PROM to Lt wrist/hand/forearm to tolerance all motions Gr 2-3 MCP, carpals and radius/ulna mobs Demonstrated KT taping for prolonged flexion stretch to wrist, 30-50% stretch for overnight sleep.  Pt unable to wear during the day so did not apply today.  Therex: LLLD wrist flex/ext stretch with 4# weight x 60 sec each directions Wrist flexion/extensionx 2 min with red therabar Wrist/arm circumduction with blue therabar x 2 min to simulate forklift operation Velcro board wrist pronation/supination x 1 rep - increased difficulty with supination   03/10/22 Therex: Prayer stretch 3x20 sec; reverse prayer stretch 3x20 sec Wrist flexion/extensionx 2 min with red therabar Red digitizer x 2 min Velcro board wrist pronation/supination x 1 rep -  increased difficulty with supination   Manual STM to Lt hand/forearm PROM to Lt wrist/hand/forearm to tolerance all motions Gr 2-3 MCP, carpals and radius/ulna mobs  03/04/22: See HEP - performed and demonstrated with trial reps PRN for comprehension   PATIENT EDUCATION: Education details: HEP Person educated: Patient Education method: Explanation, Demonstration, and Handouts Education comprehension: verbalized understanding, returned demonstration, and needs further education   HOME EXERCISE PROGRAM: Access Code: [email or web address] URL: [email or web address] Date: 03/04/2022 Prepared by: Faustino Congress  Exercises - Seated Wrist Flexion Extension PROM  - 2-3 x daily - 7 x weekly - 1 sets - 5-10 reps - 10-20 sec hold - Seated Wrist Prayer Stretch  - 2-3 x daily - 7 x weekly - 1 sets - 3 reps - 30 sec hold - Seated Reverse Wrist Prayer Stretch  - 2-3 x daily - 7 x weekly - 1 sets - 3 reps - 30 sec hold - Putty Squeezes  - 1 x daily - 7 x weekly - 1 sets - 10 reps - 5 sec hold - Seated Forearm Pronation and Supination AROM  - 1 x daily - 7 x weekly - 1 sets - 10 reps - 10-15 sec hold - Seated Finger DIP AROM  - 1 x daily - 7 x weekly - 2-3 sets - 10 reps - Seated Finger PIP AROM  - 1 x daily - 7 x weekly - 2-3 sets - 10 reps - Wrist Circumduction AROM  - 1 x daily - 7 x weekly - 2-3 sets - 10 reps  ASSESSMENT:  CLINICAL IMPRESSION: Pt tolerated session well today and nearly able to to get fingers to palm at this time for full fist.  Will continue to benefit from PT to maximize function.  OBJECTIVE IMPAIRMENTS decreased coordination, decreased ROM, decreased strength, hypomobility, increased edema, increased fascial restrictions, increased muscle spasms, impaired flexibility, impaired UE functional use, and pain.   ACTIVITY LIMITATIONS carrying, lifting, bathing, toileting, dressing, self feeding, and hygiene/grooming  PARTICIPATION LIMITATIONS: meal prep,  cleaning,  laundry, shopping, community activity, occupation, and yard work  PERSONAL FACTORS Time since onset of injury/illness/exacerbation and 3+ comorbidities: Neurogenic Bladder, 01/06/22 hand injury with circular saw resulting in partial amputation of Lt middle finger, osteopenia are also affecting patient's functional outcome.   REHAB POTENTIAL: Good  CLINICAL DECISION MAKING: Evolving/moderate complexity  EVALUATION COMPLEXITY: Moderate  GOALS: Goals reviewed with patient? Yes  SHORT TERM GOALS: Target date: 04/01/2022  Independent with initial HEP Goal status: MET 03/17/22  2.  Lt wrist AROM improved at least 5 deg all limited directions for improved function Goal status: INITIAL   LONG TERM GOALS: Target date: 04/29/2022  Independent with final HEP Goal status: INITIAL  2.  FOTO score improved to 67 Goal status: INITIAL  3.  Lt wrist AROM flexion/extension improved by 10 deg for improved ADLs Goal status: INIITAL  4.  Report pain < 3/10 with ADLs and work simulated tasks for improved function Goal status: INITIAL  5.  Demonstrate ability to create full fist with Lt hand for improved ROM and function Goal status: INITIAL   PLAN: PT FREQUENCY: 1x/week  PT DURATION: 8 weeks  PLANNED INTERVENTIONS: Therapeutic exercises, Therapeutic activity, Neuromuscular re-education, Balance training, Patient/Family education, Self Care, Joint mobilization, Dry Needling, Electrical stimulation, Cryotherapy, Moist heat, Taping, Ultrasound, Manual therapy, and Re-evaluation  PLAN FOR NEXT SESSION: measure wrist motions, continue manual for ROM as tolerated, light grip strengthening, UE strengthening     Laureen Abrahams, PT, DPT 03/17/22 9:46 AM

## 2022-03-22 ENCOUNTER — Ambulatory Visit: Payer: Self-pay

## 2022-03-22 ENCOUNTER — Ambulatory Visit (INDEPENDENT_AMBULATORY_CARE_PROVIDER_SITE_OTHER): Payer: Commercial Managed Care - HMO | Admitting: Physical Therapy

## 2022-03-22 ENCOUNTER — Ambulatory Visit (INDEPENDENT_AMBULATORY_CARE_PROVIDER_SITE_OTHER): Payer: Commercial Managed Care - HMO | Admitting: Orthopedic Surgery

## 2022-03-22 ENCOUNTER — Encounter: Payer: Self-pay | Admitting: Family Medicine

## 2022-03-22 ENCOUNTER — Encounter: Payer: Self-pay | Admitting: Physical Therapy

## 2022-03-22 DIAGNOSIS — M79645 Pain in left finger(s): Secondary | ICD-10-CM | POA: Diagnosis not present

## 2022-03-22 DIAGNOSIS — M6281 Muscle weakness (generalized): Secondary | ICD-10-CM

## 2022-03-22 DIAGNOSIS — S52532D Colles' fracture of left radius, subsequent encounter for closed fracture with routine healing: Secondary | ICD-10-CM

## 2022-03-22 DIAGNOSIS — M25542 Pain in joints of left hand: Secondary | ICD-10-CM | POA: Diagnosis not present

## 2022-03-22 DIAGNOSIS — M25532 Pain in left wrist: Secondary | ICD-10-CM

## 2022-03-22 DIAGNOSIS — M25632 Stiffness of left wrist, not elsewhere classified: Secondary | ICD-10-CM | POA: Diagnosis not present

## 2022-03-22 DIAGNOSIS — S68623A 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, initial encounter: Secondary | ICD-10-CM

## 2022-03-22 DIAGNOSIS — M25642 Stiffness of left hand, not elsewhere classified: Secondary | ICD-10-CM

## 2022-03-22 NOTE — Progress Notes (Signed)
   Post-Op Visit Note   Patient: Mary Rogers           Date of Birth: 06/22/1966           MRN: [id number] Visit Date: 03/22/2022 PCP: Pcp, No   Assessment & Plan:  Chief Complaint:  Chief Complaint  Patient presents with   Left Middle Finger - Follow-up   Left Wrist - Follow-up   Visit Diagnoses:  1. Partial traumatic amputation of left middle finger through phalanx, initial encounter   2. Closed Colles' fracture of left radius with routine healing, subsequent encounter     Plan: Patient is approximately 8 weeks s/p revision amputation of the left middle finger through the distal middle phalanx.  Her amputation site looks great and has healed very well.  She is working with therapy on desensitization of the amputation site and ROM of the fingers.  She still has stiffness in the index and middle fingers that is affected her ability to grip and manipulate objects.  She has no pain in the wrist but lacks some flexion compared to the contralateral side.  Discussed that this may continue to improve with therapy.  Her x-rays are unchanged from previous with a healed distal radius fracture with neutral tilt.  I can see her back after some more time in therapy.   Follow-Up Instructions: No follow-ups on file.   Orders:  Orders Placed This Encounter  Procedures   XR Wrist Complete Left   No orders of the defined types were placed in this encounter.   Imaging: No results found.  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

## 2022-03-22 NOTE — Therapy (Addendum)
OUTPATIENT PHYSICAL THERAPY TREATMENT NOTE DISCHARGE SUMMARY   Patient Name: Mary Rogers MRN: [id number] DOB:08-25-1965, 57 y.o., female Today's Date: 03/22/2022   END OF SESSION:   PT End of Session - 03/22/22 1302     Visit Number 4    Number of Visits 6    Date for PT Re-Evaluation 04/15/22    Authorization Type [employer] $70 copay    PT Start Time 1300    PT Stop Time 1344    PT Time Calculation (min) 44 min    Activity Tolerance Patient tolerated treatment well    Behavior During Therapy [facility] for tasks assessed/performed               Past Medical History:  Diagnosis Date   Diverticulitis    Fatty liver    Neurogenic bladder    Past Surgical History:  Procedure Laterality Date   AMPUTATION Left 01/24/2022   Procedure: REVISION AMPUTATION LEFT MIDDLE FINGER;  Surgeon: Sherilyn Cooter, MD;  Location: [place];  Service: Orthopedics;  Laterality: Left;   CESAREAN SECTION     CHOLECYSTECTOMY     COLON SURGERY     Patient Active Problem List   Diagnosis Date Noted   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    Open displaced fracture of distal phalanx of finger of left hand 01/14/2022   Laceration of left middle finger w/o foreign body w/o damage to nail 01/14/2022   Laceration of left index finger 01/07/2022   Fracture, Colles, left, closed 11/16/2021     THERAPY DIAG:  Pain in left wrist  Stiffness of left wrist, not elsewhere classified  Pain in joints of left hand  Pain in left finger(s)  Stiffness of left hand, not elsewhere classified  Muscle weakness (generalized)  PCP: No PCP  REFERRING PROVIDER: Gregor Hams, MD   REFERRING DIAG: S52.532D (ICD-10-CM) - Closed Colles' fracture of left radius with routine healing, subsequent encounter M25.532 (ICD-10-CM) - Left wrist pain   Rationale for Evaluation and Treatment Rehabilitation  ONSET DATE: 10/30/21  SUBJECTIVE:                                                                                                                                                                                       SUBJECTIVE STATEMENT: Tried KT tape but it was too uncomfortable on middle finger with DIP amputation so had to remove.    PERTINENT HISTORY: Neurogenic Bladder, 01/06/22 hand injury with circular saw resulting in partial amputation of Lt middle finger, osteopenia  PAIN:  Are you having pain? Only when flexing middle finger and end ranges of wrist  PRECAUTIONS: None  PATIENT GOALS improve mobility  OBJECTIVE:   PATIENT SURVEYS:  03/04/22: FOTO 46 (predicted 67)  UPPER EXTREMITY ROM:   Active ROM Right eval Left eval Left 03/10/22 Left 03/22/22  Elbow extension  -5    Wrist flexion  27 42   Wrist extension  35 45   Wrist ulnar deviation  13  10  Wrist radial deviation  5  8  Wrist pronation  70  90  Wrist supination  90  90  (Blank rows = not tested)   Passive ROM Right eval Left eval  Wrist flexion  30  Wrist extension  50  Wrist ulnar deviation  16  Wrist radial deviation  12  (Blank rows = not tested)  UPPER EXTREMITY MMT: Lt wrist 3-/5  JOINT MOBILITY TESTING:  03/04/22: hypomobility throughout wrist, hand and fingers on Lt    TODAY'S TREATMENT:  03/22/22 Manual STM to Lt hand/forearm PROM to Lt wrist/hand/forearm to tolerance all motions Gr 2-3 MCP, carpals and radius/ulna mob  Therex: Wrist flexion/extension x 2 min with blue therabar Wrist/arm circumduction with blue therabar x 2 min to simulate forklift operation Velcro board wrist pronation/supination x 1 rep, then middle finger extension on velcro board x 20 reps Radial/Ulnar deviation x 15 reps with L2 band, also tried with 1-3# weights, theraband most effective  03/17/22 Manual STM to Lt hand/forearm PROM to Lt wrist/hand/forearm to tolerance all motions Gr 2-3 MCP, carpals and radius/ulna mobs Demonstrated KT taping for prolonged flexion stretch to wrist,  30-50% stretch for overnight sleep.  Pt unable to wear during the day so did not apply today.  Therex: LLLD wrist flex/ext stretch with 4# weight x 60 sec each directions Wrist flexion/extensionx 2 min with red therabar Wrist/arm circumduction with blue therabar x 2 min to simulate forklift operation Velcro board wrist pronation/supination x 1 rep - increased difficulty with supination   03/10/22 Therex: Prayer stretch 3x20 sec; reverse prayer stretch 3x20 sec Wrist flexion/extensionx 2 min with red therabar Red digitizer x 2 min Velcro board wrist pronation/supination x 1 rep - increased difficulty with supination   Manual STM to Lt hand/forearm PROM to Lt wrist/hand/forearm to tolerance all motions Gr 2-3 MCP, carpals and radius/ulna mobs  PATIENT EDUCATION: Education details: HEP Person educated: Patient Education method: Consulting civil engineer, Media planner, and Handouts Education comprehension: verbalized understanding, returned demonstration, and needs further education   HOME EXERCISE PROGRAM: Access Code: [email or web address] URL: [email or web address] Date: 03/04/2022 Prepared by: Faustino Congress  Exercises - Seated Wrist Flexion Extension PROM  - 2-3 x daily - 7 x weekly - 1 sets - 5-10 reps - 10-20 sec hold - Seated Wrist Prayer Stretch  - 2-3 x daily - 7 x weekly - 1 sets - 3 reps - 30 sec hold - Seated Reverse Wrist Prayer Stretch  - 2-3 x daily - 7 x weekly - 1 sets - 3 reps - 30 sec hold - Putty Squeezes  - 1 x daily - 7 x weekly - 1 sets - 10 reps - 5 sec hold - Seated Forearm Pronation and Supination AROM  - 1 x daily - 7 x weekly - 1 sets - 10 reps - 10-15 sec hold - Seated Finger DIP AROM  - 1 x daily - 7 x weekly - 2-3 sets - 10 reps - Seated Finger PIP AROM  - 1 x daily - 7 x weekly - 2-3 sets - 10 reps - Wrist Circumduction AROM  - 1 x daily - 7 x weekly -  2-3 sets - 10 reps  ASSESSMENT:  CLINICAL IMPRESSION: Pt overall doing well with PT, working on  increased strength and motion as able.   Pt tolerated session well today and nearly able to to get fingers to palm at this time for full fist.  Will continue to benefit from PT to maximize function.  OBJECTIVE IMPAIRMENTS decreased coordination, decreased ROM, decreased strength, hypomobility, increased edema, increased fascial restrictions, increased muscle spasms, impaired flexibility, impaired UE functional use, and pain.   ACTIVITY LIMITATIONS carrying, lifting, bathing, toileting, dressing, self feeding, and hygiene/grooming  PARTICIPATION LIMITATIONS: meal prep, cleaning, laundry, shopping, community activity, occupation, and yard work  PERSONAL FACTORS Time since onset of injury/illness/exacerbation and 3+ comorbidities: Neurogenic Bladder, 01/06/22 hand injury with circular saw resulting in partial amputation of Lt middle finger, osteopenia are also affecting patient's functional outcome.   REHAB POTENTIAL: Good  CLINICAL DECISION MAKING: Evolving/moderate complexity  EVALUATION COMPLEXITY: Moderate  GOALS: Goals reviewed with patient? Yes  SHORT TERM GOALS: Target date: 04/01/2022  Independent with initial HEP Goal status: MET 03/17/22  2.  Lt wrist AROM improved at least 5 deg all limited directions for improved function Goal status: Ongoing 03/22/22   LONG TERM GOALS: Target date: 04/29/2022  Independent with final HEP Goal status: INITIAL  2.  FOTO score improved to 67 Goal status: INITIAL  3.  Lt wrist AROM flexion/extension improved by 10 deg for improved ADLs Goal status: INIITAL  4.  Report pain < 3/10 with ADLs and work simulated tasks for improved function Goal status: INITIAL  5.  Demonstrate ability to create full fist with Lt hand for improved ROM and function Goal status: INITIAL   PLAN: PT FREQUENCY: 1x/week  PT DURATION: 8 weeks  PLANNED INTERVENTIONS: Therapeutic exercises, Therapeutic activity, Neuromuscular re-education, Balance training,  Patient/Family education, Self Care, Joint mobilization, Dry Needling, Electrical stimulation, Cryotherapy, Moist heat, Taping, Ultrasound, Manual therapy, and Re-evaluation  PLAN FOR NEXT SESSION: continue manual for ROM as tolerated, light grip strengthening, UE strengthening     Laureen Abrahams, PT, DPT 03/22/22 3:04 PM      PHYSICAL THERAPY DISCHARGE SUMMARY  Visits from Start of Care: 4  Current functional level related to goals / functional outcomes: See above   Remaining deficits: See above   Education / Equipment: HEP   Patient agrees to discharge. Patient goals were not met. Patient is being discharged due to  change in care to occupational therapy with certified hand therapist.   Laureen Abrahams, PT, DPT 05/05/22 12:17 PM  [facility] [address] [place], [place], [address] Phone: [phone]   Fax:  [phone]

## 2022-03-23 ENCOUNTER — Encounter: Payer: Self-pay | Admitting: Rehabilitative and Restorative Service Providers"

## 2022-03-23 ENCOUNTER — Ambulatory Visit (INDEPENDENT_AMBULATORY_CARE_PROVIDER_SITE_OTHER): Payer: Commercial Managed Care - HMO | Admitting: Rehabilitative and Restorative Service Providers"

## 2022-03-23 ENCOUNTER — Other Ambulatory Visit: Payer: Self-pay

## 2022-03-23 ENCOUNTER — Other Ambulatory Visit: Payer: Self-pay | Admitting: Orthopedic Surgery

## 2022-03-23 DIAGNOSIS — M25632 Stiffness of left wrist, not elsewhere classified: Secondary | ICD-10-CM

## 2022-03-23 DIAGNOSIS — S68623A 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, initial encounter: Secondary | ICD-10-CM

## 2022-03-23 DIAGNOSIS — M25642 Stiffness of left hand, not elsewhere classified: Secondary | ICD-10-CM | POA: Diagnosis not present

## 2022-03-23 DIAGNOSIS — M6281 Muscle weakness (generalized): Secondary | ICD-10-CM

## 2022-03-23 DIAGNOSIS — R202 Paresthesia of skin: Secondary | ICD-10-CM | POA: Diagnosis not present

## 2022-03-23 DIAGNOSIS — M25532 Pain in left wrist: Secondary | ICD-10-CM | POA: Diagnosis not present

## 2022-03-23 NOTE — Therapy (Signed)
OUTPATIENT OCCUPATIONAL THERAPY ORTHO EVALUATION  Patient Name: Mary Rogers MRN: [id number] DOB:12/12/1965, 56 y.o., female Today's Date: 03/23/2022  PCP: N/A  REFERRING PROVIDER: Dr. Sherilyn Cooter    OT End of Session - 03/23/22 1059     Visit Number 1    Number of Visits 10    Date for OT Re-Evaluation 05/06/22    OT Start Time 1100    OT Stop Time 1230    OT Time Calculation (min) 90 min    Equipment Utilized During Treatment gel digital cap, therapy putty    Activity Tolerance Patient tolerated treatment well;No increased pain;Patient limited by pain;Patient limited by fatigue    Behavior During Therapy [facility] for tasks assessed/performed             Past Medical History:  Diagnosis Date   Diverticulitis    Fatty liver    Neurogenic bladder    Past Surgical History:  Procedure Laterality Date   AMPUTATION Left 01/24/2022   Procedure: REVISION AMPUTATION LEFT MIDDLE FINGER;  Surgeon: Sherilyn Cooter, MD;  Location: [place];  Service: Orthopedics;  Laterality: Left;   CESAREAN SECTION     CHOLECYSTECTOMY     COLON SURGERY     Patient Active Problem List   Diagnosis Date Noted   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    Open displaced fracture of distal phalanx of finger of left hand 01/14/2022   Laceration of left middle finger w/o foreign body w/o damage to nail 01/14/2022   Laceration of left index finger 01/07/2022   Fracture, Colles, left, closed 11/16/2021    ONSET DATE: 10/30/21 DOI (wrist fx), 01/24/22 DOS (amputation)   REFERRING DIAG: M62.863O (ICD-10-CM) - Partial traumatic amputation of left middle finger through phalanx, initial encounter   THERAPY DIAG:  Pain in left wrist  Stiffness of left wrist, not elsewhere classified  Paresthesia of skin  Stiffness of left hand, not elsewhere classified  Muscle weakness (generalized)  Rationale for Evaluation and Treatment Rehabilitation  SUBJECTIVE:   SUBJECTIVE  STATEMENT: 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 She enjoys gardening. She has recently broken her left wrist and had traumatic partial amputation of Lt MF. She states cutting Lt MF with circular saw and needing amputation revision on 01/24/22. She also fell and fx Lt distal radius 10/30/21 and has recently been in PT for rehab of left wrist. PT will be stopping their POC for that rehab in order for her to be seen by OP OT for amputation and wrist rehab without any duplication of services.     PERTINENT HISTORY: Per MD: "Left hand and wrist stiffness.  L DRF (closed Colles') treated nonoperatively.  L middle finger amputation." Amputation through distal P2 of L MF.   PRECAUTIONS: 8 weeks s/p amputation revision, 20+ weeks out from Lt DRF with conservative management   WEIGHT BEARING RESTRICTIONS No  PAIN:  Are you having pain? Yes Rating: 1-2/10 at rest now in residual Lt MF tip and soreness in wrist from stretches  FALLS: Has patient fallen in last 6 months? Yes. Number of falls 1 (wrist fx accident)   LIVING ENVIRONMENT: Lives with: lives with their spouse  PLOF: Independent  PATIENT GOALS Maximize use of left hand and wrist.    OBJECTIVE:   HAND DOMINANCE: Right   ADLs: Overall ADLs: States decreased ability to grab, hold household objects, pain and inability to open containers, perform all  FMS tasks, etc.    FUNCTIONAL OUTCOME MEASURES: Eval: Patient Specific Functional Scale: 4.6 (wash a glass, turn fork lift wheel (spinner knob), opening jars)  UPPER EXTREMITY ROM     Active ROM Left eval  Wrist flexion 33  Wrist extension 41  Wrist ulnar deviation 19  Wrist radial deviation 15  Wrist pronation 60  Wrist supination 69  (Blank rows = not tested)  Active ROM Left eval  Thumb MCP (0-60) 25  Thumb IP (0-80) 41  Thumb Opposition to Small Finger Yes, but 2.5cm from base of SF   Index MCP (0-90) 0-65   Index  PIP (0-100)  0-90  Index DIP (0-70) 0-56   Long MCP (0-90) 0-75   Long PIP (0-100) 0-78   (Blank rows = not tested)   UPPER EXTREMITY MMT:     MMT Left eval  Elbow flexion   Elbow extension   Wrist flexion 4/5  Wrist extension 4-/5 tender  Wrist ulnar deviation   Wrist radial deviation   Wrist pronation 4-/5 tender  Wrist supination 4-/5 tender  (Blank rows = not tested)  HAND FUNCTION: Eval: Grip strength Right: 74 lbs, Left: 17 lbs   COORDINATION: Eval: Box and Blocks Test: 54 Blocks today (54 is WFL, 73 is avg)  SENSATION: Eval:  Light touch intact today, though some hypersensitivity around tip of residual MF, though it has been improving   EDEMA:   Eval: 5.8 around distal residual tip of MF today  OBSERVATIONS:   Eval: she has little to no tenderness in wrist now, but some pain/tenderness with resistance and is stiff and weak still.  Her hand does not make tight fist yet, and though MF hypersensitivity is improving, it's still a bit painful, stiff, swollen, weak grip and pinch, etc.    TODAY'S TREATMENT:  Eval: OT supplied her with digital gel protection cap for residual MF tip to help shape digit (manage swelling) absorb shock, prevent pain (self-care). It fit well and she is able to tap tip of finger on table with no significant pain, once applied. She was edu when to wear, when to remove (especially if impacting circulation in any way).  OT also educates on importance of including MF in all functional activities to prevent stiffness and help with desensitization, motion and strength. Additionally OT edu on progressive desensitization program for her to do at home, and very comprehensive HEP including both exercises and activities to work on wrist and hand flexibility, strength and functional use, as listed below:   Exercises - Seated Wrist Flexion Extension PROM  - 2-3 x daily - 7 x weekly - 1 sets - 5-10 reps - 10-20 sec hold - Seated Wrist Prayer Stretch  - 2-3 x  daily - 7 x weekly - 1 sets - 3 reps - 30 sec hold - Hammer Stretch or Strength   - 2-4 x daily - 1-2 sets - 10-15 reps - Seated Single Digit Intrinsic Stretch  - 4-6 x daily - 3-5 reps - 15-20 sec hold - Putty Squeezes  - 1 x daily - 7 x weekly - 1 sets - 10 reps - 5 sec hold - Seated Claw Fist with Putty  - 2-3 x daily - 5 reps - Finger Extension "Pizza!"   - 2-3 x daily - 5 reps - Thumb Press  - 2-3 x daily - 5 reps - Tendon Glides  - 3-4 x daily - 3-5 reps - 2-3 seconds hold - Seated Finger DIP AROM  -  1 x daily - 7 x weekly - 2-3 sets - 10 reps - Wrist Flexion with Resistance  - 2-4 x daily - 1-2 sets - 10-15 reps - Wrist Extension with Resistance  - 2-4 x daily - 1-2 sets - 10-15 reps   She tolerates these well and demo's back with little soreness, stating understanding.    PATIENT EDUCATION: Education details: See tx section above for details  Person educated: Patient Education method: Verbal Instruction, Teach back, Handouts  Education comprehension: States and demonstrates understanding, Additional Education required    HOME EXERCISE PROGRAM: Access Code: [email or web address] URL: [email or web address] Date: 03/23/2022 Prepared by: Benito Mccreedy  GOALS: Goals reviewed with patient? Yes   SHORT TERM GOALS: (STG required if POC>30 days)  Pt will obtain protective, custom orthotic and also mobilizing orthotics as needed. Target date: 04/01/22 Goal status: INITIAL  2.  Pt will demo/state understanding of initial HEP to improve pain levels and prerequisite motion. Target date: 03/23/22 Goal status: MET 03/23/22   LONG TERM GOALS:  Pt will improve functional ability by decreased impairment per PSFS assessment from 4.6 to 7 or better, for better quality of life. Target date: 05/06/22 Goal status: INITIAL  2.  Pt will improve grip strength in left hand from 17lbs to at least 40lbs for functional use at home and in IADLs. Target date: 05/06/22 Goal status:  INITIAL  3.  Pt will improve A/ROM in left wrist flex and ext from 33*/34* to at least 45* each, to have functional motion for tasks like reach and grasp.  Target date: 05/06/22 Goal status: INITIAL  4.  Pt will improve strength in left wrist from tender 4-/5 MMT to at least 4+/5 MMT to have increased functional ability to carry out selfcare and higher-level homecare tasks with no difficulty. Target date: 05/06/22 Goal status: INITIAL   ASSESSMENT:  CLINICAL IMPRESSION: Patient is a 56 y.o. female who was seen today for occupational therapy evaluation for left wrist stiffness, weakness, and left MF pain and management of decreased function after amputation. She will benefit from OP OT to help increase quality of life.   PERFORMANCE DEFICITS in functional skills including ADLs, IADLs, coordination, dexterity, sensation, edema, ROM, strength, pain, fascial restrictions, flexibility, FMC, GMC, body mechanics, endurance, and UE functional use, cognitive skills including energy/drive, and psychosocial skills including coping strategies, environmental adaptation, and routines and behaviors.   IMPAIRMENTS are limiting patient from IADLs, work, play, and leisure.   COMORBIDITIES may have co-morbidities  that affects occupational performance. Patient will benefit from skilled OT to address above impairments and improve overall function.  MODIFICATION OR ASSISTANCE TO COMPLETE EVALUATION: No modification of tasks or assist necessary to complete an evaluation.  OT OCCUPATIONAL PROFILE AND HISTORY: Problem focused assessment: Including review of records relating to presenting problem.  CLINICAL DECISION MAKING: High - multiple treatment options, significant modification of task necessary  REHAB POTENTIAL: Good  EVALUATION COMPLEXITY: Low      PLAN: OT FREQUENCY: 1-2x/week (tapering as appropriate)   OT DURATION: 6 weeks (through 05/06/22)   PLANNED INTERVENTIONS: self care/ADL training,  therapeutic exercise, therapeutic activity, neuromuscular re-education, manual therapy, scar mobilization, passive range of motion, splinting, electrical stimulation, fluidotherapy, compression bandaging, moist heat, cryotherapy, contrast bath, patient/family education, coping strategies training, and DME and/or AE instructions  RECOMMENDED OTHER SERVICES: none now   CONSULTED AND AGREED WITH PLAN OF CARE: Patient  PLAN FOR NEXT SESSION: Likely will need to create dynamic orthosis for wrist and/or fingers due to residual stiffness  lingering more long-term since injuries. Review HEP and upgrade as tolerated to whole arm strength. Perform manual therapy as needed to help mobilize and decrease pain. Add DTM and other in-hand manipulations to work on [place] in left hand as well.    Benito Mccreedy, OTR/L, CHT 03/23/2022, 1:23 PM

## 2022-03-24 ENCOUNTER — Telehealth: Payer: Self-pay | Admitting: Orthopedic Surgery

## 2022-03-24 ENCOUNTER — Encounter: Payer: Self-pay | Admitting: Family Medicine

## 2022-03-24 NOTE — Telephone Encounter (Signed)
This has been done.

## 2022-03-24 NOTE — Telephone Encounter (Addendum)
Patient called in stating she needs a return to work note stating she may return to work 09/18, patient states [employer] has her paperwork and they will not accept a note from her other doctor Dr. Logan Bores they need one from Dr. Frazier Butt by today or she will lose her job please advise

## 2022-03-29 ENCOUNTER — Ambulatory Visit (INDEPENDENT_AMBULATORY_CARE_PROVIDER_SITE_OTHER): Payer: Commercial Managed Care - HMO | Admitting: Rehabilitative and Restorative Service Providers"

## 2022-03-29 ENCOUNTER — Encounter: Payer: Self-pay | Admitting: Rehabilitative and Restorative Service Providers"

## 2022-03-29 ENCOUNTER — Encounter: Payer: 59 | Admitting: Physical Therapy

## 2022-03-29 DIAGNOSIS — M79645 Pain in left finger(s): Secondary | ICD-10-CM

## 2022-03-29 DIAGNOSIS — M25532 Pain in left wrist: Secondary | ICD-10-CM

## 2022-03-29 DIAGNOSIS — M25642 Stiffness of left hand, not elsewhere classified: Secondary | ICD-10-CM

## 2022-03-29 DIAGNOSIS — M25632 Stiffness of left wrist, not elsewhere classified: Secondary | ICD-10-CM | POA: Diagnosis not present

## 2022-03-29 DIAGNOSIS — R202 Paresthesia of skin: Secondary | ICD-10-CM | POA: Diagnosis not present

## 2022-03-29 DIAGNOSIS — M6281 Muscle weakness (generalized): Secondary | ICD-10-CM

## 2022-03-29 DIAGNOSIS — M25542 Pain in joints of left hand: Secondary | ICD-10-CM

## 2022-03-29 NOTE — Therapy (Signed)
OUTPATIENT OCCUPATIONAL THERAPY TREATMENT NOTE   Patient Name: Mary Rogers MRN: [id number] DOB:06/12/1966, 56 y.o., female Today's Date: 03/29/2022  PCP: N/A  REFERRING PROVIDER: Dr. Sherilyn Cooter   END OF SESSION:   OT End of Session - 03/29/22 0841     Visit Number 2    Number of Visits 10    Date for OT Re-Evaluation 05/06/22    OT Start Time 0845    OT Stop Time 0940    OT Time Calculation (min) 55 min    Equipment Utilized During Treatment orthotic materials    Activity Tolerance Patient tolerated treatment well;No increased pain;Patient limited by fatigue    Behavior During Therapy [facility] for tasks assessed/performed             Past Medical History:  Diagnosis Date   Diverticulitis    Fatty liver    Neurogenic bladder    Past Surgical History:  Procedure Laterality Date   AMPUTATION Left 01/24/2022   Procedure: REVISION AMPUTATION LEFT MIDDLE FINGER;  Surgeon: Sherilyn Cooter, MD;  Location: [facility];  Service: Orthopedics;  Laterality: Left;   CESAREAN SECTION     CHOLECYSTECTOMY     COLON SURGERY     Patient Active Problem List   Diagnosis Date Noted   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    Open displaced fracture of distal phalanx of finger of left hand 01/14/2022   Laceration of left middle finger w/o foreign body w/o damage to nail 01/14/2022   Laceration of left index finger 01/07/2022   Fracture, Colles, left, closed 11/16/2021    ONSET DATE: 10/30/21 DOI (wrist fx), 01/24/22 DOS (amputation)    REFERRING DIAG: J18.841Y (ICD-10-CM) - Partial traumatic amputation of left middle finger through phalanx, initial encounter   THERAPY DIAG:  Pain in left wrist  Stiffness of left wrist, not elsewhere classified  Paresthesia of skin  Stiffness of left hand, not elsewhere classified  Muscle weakness (generalized)  Pain in joints of left hand  Pain in left finger(s)  Rationale for Evaluation and Treatment  Rehabilitation  PERTINENT HISTORY: Per MD: "Left hand and wrist stiffness.  L DRF (closed Colles') treated nonoperatively.  L middle finger amputation." Amputation through distal P2 of L MF.    PRECAUTIONS: 9 weeks s/p amputation revision, 20+ weeks out from Lt DRF with conservative management    WEIGHT BEARING RESTRICTIONS No  SUBJECTIVE:  She states not doing HEP as often as asked, only using putty "maybe once a day," etc. She states she is busy trying to build her home with her husband.   PAIN:  Are you having pain? Yes  Rating: 1-2/10 at rest now in residual Lt MF tip and soreness in wrist from stretches   OBJECTIVE: (All objective assessments below are from initial evaluation on: 03/23/22 unless otherwise specified.)   HAND DOMINANCE: Right    ADLs: Overall ADLs: States decreased ability to grab, hold household objects, pain and inability to open containers, perform all FMS tasks, etc.      FUNCTIONAL OUTCOME MEASURES: Eval: Patient Specific Functional Scale: 4.6 (wash a glass, turn fork lift wheel (spinner knob), opening jars)   UPPER EXTREMITY ROM      Active ROM Left eval Left 03/29/22  Wrist flexion 33 31  Wrist extension 41 42  Wrist ulnar deviation 19   Wrist radial deviation 15   Wrist pronation 60   Wrist supination 69   (Blank rows = not tested)   Active ROM  Left eval Left 03/29/22  Thumb MCP (0-60) 25   Thumb IP (0-80) 41   Thumb Opposition to Small Finger Yes, but 2.5cm from base of SF    Index MCP (0-90) 0-65  0-62  Index PIP (0-100)  0-90 0-94  Index DIP (0-70) 0-56  0-58  Long MCP (0-90) 0-75    Long PIP (0-100) 0-78    (Blank rows = not tested)     UPPER EXTREMITY MMT:      MMT Left eval  Elbow flexion    Elbow extension    Wrist flexion 4/5  Wrist extension 4-/5 tender  Wrist ulnar deviation    Wrist radial deviation    Wrist pronation 4-/5 tender  Wrist supination 4-/5 tender  (Blank rows = not tested)   HAND FUNCTION: 03/29/22:  Left grip: 25#  Eval: Grip strength Right: 74 lbs, Left: 17 lbs    COORDINATION: Eval: Box and Blocks Test: 54 Blocks today (54 is WFL, 73 is avg)   SENSATION: Eval:  Light touch intact today, though some hypersensitivity around tip of residual MF, though it has been improving    EDEMA:             Eval: 5.8 around distal residual tip of MF today   OBSERVATIONS:             Eval: she has little to no tenderness in wrist now, but some pain/tenderness with resistance and is stiff and weak still.  Her hand does not make tight fist yet, and though MF hypersensitivity is improving, it's still a bit painful, stiff, swollen, weak grip and pinch, etc.      TODAY'S TREATMENT:  03/29/22: She does AROM and strength checks today, not making significant motion progress in wrist or fingers. OT asks if she's been faithful to 3-4 x day HEP and she states no she has not, ut she's continued "working on her house" and "shoveling" and such.  Shoveling may improve grip strength but not motion, OT warns her she must do HEP as asked or she will not make progress and will remain stiff chronically. OT is very strict about this message, and she states understanding.  Custom orthotic fabrication was indicated due to pt's stiff hand and wrist and need for dynamic mobilization. OT fabricated custom dynamic finger flexion orthotic for pt today to passively stretch fingers and thumb 3x day for 15 minute periods as tolerated. It fit well with no areas of pressure, pt states a comfortable fit. Pt was educated on the wearing schedule, to remove and come  in ASAP if it is causing any irritation or is not achieving desired function. It will be checked/adjusted in upcoming sessions, as needed. Pt states understanding.  It may also be adjusted to include dynamic wrist flexion and ext (or perhaps a secondary orthotic will be needed for those).    Eval: OT supplied her with digital gel protection cap for residual MF tip to help shape  digit (manage swelling) absorb shock, prevent pain (self-care). It fit well and she is able to tap tip of finger on table with no significant pain, once applied. She was edu when to wear, when to remove (especially if impacting circulation in any way).  OT also educates on importance of including MF in all functional activities to prevent stiffness and help with desensitization, motion and strength. Additionally OT edu on progressive desensitization program for her to do at home, and very comprehensive HEP including both exercises and activities  to work on wrist and hand flexibility, strength and functional use, as listed below:    Exercises - Seated Wrist Flexion Extension PROM  - 2-3 x daily - 7 x weekly - 1 sets - 5-10 reps - 10-20 sec hold - Seated Wrist Prayer Stretch  - 2-3 x daily - 7 x weekly - 1 sets - 3 reps - 30 sec hold - Hammer Stretch or Strength   - 2-4 x daily - 1-2 sets - 10-15 reps - Seated Single Digit Intrinsic Stretch  - 4-6 x daily - 3-5 reps - 15-20 sec hold - Putty Squeezes  - 1 x daily - 7 x weekly - 1 sets - 10 reps - 5 sec hold - Seated Claw Fist with Putty  - 2-3 x daily - 5 reps - Finger Extension "Pizza!"   - 2-3 x daily - 5 reps - Thumb Press  - 2-3 x daily - 5 reps - Tendon Glides  - 3-4 x daily - 3-5 reps - 2-3 seconds hold - Seated Finger DIP AROM  - 1 x daily - 7 x weekly - 2-3 sets - 10 reps - Wrist Flexion with Resistance  - 2-4 x daily - 1-2 sets - 10-15 reps - Wrist Extension with Resistance  - 2-4 x daily - 1-2 sets - 10-15 reps  She tolerates these well and demo's back with little soreness, stating understanding.      PATIENT EDUCATION: Education details: See tx section above for details  Person educated: Patient Education method: Verbal Instruction, Teach back, Handouts  Education comprehension: States and demonstrates understanding, Additional Education required      HOME EXERCISE PROGRAM: Access Code: [email or web address] URL:  [email or web address] Date: 03/23/2022 Prepared by: Benito Mccreedy   GOALS: Goals reviewed with patient? Yes     SHORT TERM GOALS: (STG required if POC>30 days)   Pt will obtain protective, custom orthotic and also mobilizing orthotics as needed. Target date: 04/01/22 Goal status: 03/29/22: partially met- has MF tip protector, also has dynamic finger flexion orthotic, may need dynamic wrist orthotic and further adjustments.    2.  Pt will demo/state understanding of initial HEP to improve pain levels and prerequisite motion. Target date: 03/23/22 Goal status: MET 03/23/22     LONG TERM GOALS:   Pt will improve functional ability by decreased impairment per PSFS assessment from 4.6 to 7 or better, for better quality of life. Target date: 05/06/22 Goal status: INITIAL   2.  Pt will improve grip strength in left hand from 17lbs to at least 40lbs for functional use at home and in IADLs. Target date: 05/06/22 Goal status: INITIAL   3.  Pt will improve A/ROM in left wrist flex and ext from 33*/34* to at least 45* each, to have functional motion for tasks like reach and grasp.  Target date: 05/06/22 Goal status: INITIAL   4.  Pt will improve strength in left wrist from tender 4-/5 MMT to at least 4+/5 MMT to have increased functional ability to carry out selfcare and higher-level homecare tasks with no difficulty. Target date: 05/06/22 Goal status: INITIAL     ASSESSMENT:   CLINICAL IMPRESSION: 03/29/22: Her prognosis will be poor if she continues to choose to work on house projects rather that do frequent stretches and exercises as OT has asked her to do. Continue on   Eval: Patient is a 56 y.o. female who was seen today for occupational therapy evaluation for left wrist stiffness, weakness, and left MF pain  and management of decreased function after amputation. She will benefit from OP OT to help increase quality of life.      PLAN: OT FREQUENCY: 1-2x/week (tapering as  appropriate)    OT DURATION: 6 weeks (through 05/06/22)    PLANNED INTERVENTIONS: self care/ADL training, therapeutic exercise, therapeutic activity, neuromuscular re-education, manual therapy, scar mobilization, passive range of motion, splinting, electrical stimulation, fluidotherapy, compression bandaging, moist heat, cryotherapy, contrast bath, patient/family education, coping strategies training, and DME and/or AE instructions   RECOMMENDED OTHER SERVICES: none now    CONSULTED AND AGREED WITH PLAN OF CARE: Patient   PLAN FOR NEXT SESSION:  Check new orthotic and modify to include wrist or make new wrist flex/ext orthotic as well to mobilize wrist. Check motion and ensure orthotics are help and ensure she is doing HEP as asked.  Upgrade as tolerated to whole arm strength when appropriate . Perform manual therapy as needed to help mobilize and decrease pain. Add DTM and other in-hand manipulations to work on [place] in left hand as well.    Benito Mccreedy, OTR/L, CHT 03/29/2022, 5:23 PM

## 2022-04-01 NOTE — Progress Notes (Unsigned)
   I, Philbert Riser, LAT, ATC acting as a scribe for Clementeen Graham, MD.  Mary Rogers is a 56 y.o. female who presents to [facility] today for f/u of L wrist pain due to a transverse fx of the distal radial fx w/ dorsal impaction that occurred on Saturday, 10/30/21, after suffering a fall, landing on an outstretched L hand. Pt was last seen by Dr. Denyse Amass on 03/07/22 and was advised to proceed to hand therapy, as previously referred, completing 2 visits. Today, pt reports  Dx imaging: 02/03/22 L wrist XR             01/14/22 L wrist XR             12/30/21 L wrist XR & Bone density scan             12/03/21 L wrist XR             11/26/21 L wrist XR             11/19/21 L wrist XR             11/16/21 L wrist XR             11/01/21 L hand XR  Pertinent review of systems: ***  Relevant historical information: ***   Exam:  There were no vitals taken for this visit. General: Well Developed, well nourished, and in no acute distress.   MSK: ***    Lab and Radiology Results No results found for this or any previous visit (from the past 72 hour(s)). No results found.     Assessment and Plan: 56 y.o. female with ***   PDMP not reviewed this encounter. No orders of the defined types were placed in this encounter.  No orders of the defined types were placed in this encounter.    Discussed warning signs or symptoms. Please see discharge instructions. Patient expresses understanding.   ***

## 2022-04-04 ENCOUNTER — Ambulatory Visit (INDEPENDENT_AMBULATORY_CARE_PROVIDER_SITE_OTHER): Payer: Commercial Managed Care - HMO | Admitting: Family Medicine

## 2022-04-04 VITALS — BP 104/72 | HR 69 | Ht 64.0 in | Wt 191.0 lb

## 2022-04-04 DIAGNOSIS — S52532D Colles' fracture of left radius, subsequent encounter for closed fracture with routine healing: Secondary | ICD-10-CM

## 2022-04-04 NOTE — Patient Instructions (Addendum)
Thank you for coming in today.   Continue working with the hand therapist  Let me know what you need from a work status.   Recheck as needed.

## 2022-04-05 ENCOUNTER — Ambulatory Visit (INDEPENDENT_AMBULATORY_CARE_PROVIDER_SITE_OTHER): Payer: Commercial Managed Care - HMO | Admitting: Rehabilitative and Restorative Service Providers"

## 2022-04-05 ENCOUNTER — Encounter: Payer: Commercial Managed Care - HMO | Admitting: Rehabilitative and Restorative Service Providers"

## 2022-04-05 ENCOUNTER — Encounter: Payer: Self-pay | Admitting: Rehabilitative and Restorative Service Providers"

## 2022-04-05 DIAGNOSIS — M25532 Pain in left wrist: Secondary | ICD-10-CM | POA: Diagnosis not present

## 2022-04-05 DIAGNOSIS — M25642 Stiffness of left hand, not elsewhere classified: Secondary | ICD-10-CM | POA: Diagnosis not present

## 2022-04-05 DIAGNOSIS — R202 Paresthesia of skin: Secondary | ICD-10-CM

## 2022-04-05 DIAGNOSIS — M79645 Pain in left finger(s): Secondary | ICD-10-CM

## 2022-04-05 DIAGNOSIS — M25542 Pain in joints of left hand: Secondary | ICD-10-CM

## 2022-04-05 DIAGNOSIS — M25632 Stiffness of left wrist, not elsewhere classified: Secondary | ICD-10-CM | POA: Diagnosis not present

## 2022-04-05 DIAGNOSIS — M6281 Muscle weakness (generalized): Secondary | ICD-10-CM

## 2022-04-05 NOTE — Therapy (Signed)
OUTPATIENT OCCUPATIONAL THERAPY TREATMENT NOTE   Patient Name: Mary Rogers MRN: [id number] DOB:01/15/1966, 55 y.o., female Today's Date: 04/05/2022  PCP: N/A  REFERRING PROVIDER: Dr. Charlie Benfield   END OF SESSION:   OT End of Session - 04/05/22 0937     Visit Number 3    Number of Visits 10    Date for OT Re-Evaluation 05/06/22    OT Start Time 0937    OT Stop Time 1113    OT Time Calculation (min) 96 min    Equipment Utilized During Treatment orthotic materials    Activity Tolerance Patient tolerated treatment well;No increased pain;Patient limited by fatigue    Behavior During Therapy WFL for tasks assessed/performed              Past Medical History:  Diagnosis Date   Diverticulitis    Fatty liver    Neurogenic bladder    Past Surgical History:  Procedure Laterality Date   AMPUTATION Left 01/24/2022   Procedure: REVISION AMPUTATION LEFT MIDDLE FINGER;  Surgeon: Benfield, Charlie, MD;  Location: [facility];  Service: Orthopedics;  Laterality: Left;   CESAREAN SECTION     CHOLECYSTECTOMY     COLON SURGERY     Patient Active Problem List   Diagnosis Date Noted   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    Open displaced fracture of distal phalanx of finger of left hand 01/14/2022   Laceration of left middle finger w/o foreign body w/o damage to nail 01/14/2022   Laceration of left index finger 01/07/2022   Fracture, Colles, left, closed 11/16/2021    ONSET DATE: 10/30/21 DOI (wrist fx), 01/24/22 DOS (amputation)    REFERRING DIAG: S68.623A (ICD-10-CM) - Partial traumatic amputation of left middle finger through phalanx, initial encounter   THERAPY DIAG:  Pain in left wrist  Stiffness of left wrist, not elsewhere classified  Paresthesia of skin  Stiffness of left hand, not elsewhere classified  Muscle weakness (generalized)  Pain in joints of left hand  Pain in left finger(s)  Rationale for Evaluation and Treatment  Rehabilitation  PERTINENT HISTORY: Per MD: "Left hand and wrist stiffness.  L DRF (closed Colles') treated nonoperatively.  L middle finger amputation." Amputation through distal P2 of L MF.    PRECAUTIONS: 10 weeks s/p amputation revision, 21+ weeks out from Lt DRF with conservative management    WEIGHT BEARING RESTRICTIONS No  SUBJECTIVE:  She states wearing dynamic finger orthotic everyday as asked and doing HEP more often as well. Also states starting a new job in a few weeks, etc. She states finger orthotic needs minor adjustments.   PAIN:  Are you having pain? Yes  Rating: 1/10 at rest now in residual Lt MF tip and soreness in wrist from stretches   OBJECTIVE: (All objective assessments below are from initial evaluation on: 03/23/22 unless otherwise specified.)   HAND DOMINANCE: Right    ADLs: Overall ADLs: States decreased ability to grab, hold household objects, pain and inability to open containers, perform all FMS tasks, etc.      FUNCTIONAL OUTCOME MEASURES: Eval: Patient Specific Functional Scale: 4.6 (wash a glass, turn fork lift wheel (spinner knob), opening jars)   UPPER EXTREMITY ROM      Active ROM Left eval Left 03/29/22 Left 04/05/22  Wrist flexion 33 31 44  Wrist extension 41 42 45  Wrist ulnar deviation 19    Wrist radial deviation 15    Wrist pronation 60    Wrist supination   69    (Blank rows = not tested)   Active ROM Left eval Left 03/29/22 Left 04/05/22  Thumb MCP (0-60) 25  31  Thumb IP (0-80) 41  46  Thumb Opposition to Small Finger Yes, but 2.5cm from base of SF   3cm gap to base today  Index MCP (0-90) 0-65  0-62 0 -66  Index PIP (0-100)  0-90 0-94 0 -96  Index DIP (0-70) 0-56  0-58 0 -61  Long MCP (0-90) 0-75   0 -79  Long PIP (0-100) 0-78   0 - 84  (Blank rows = not tested)     UPPER EXTREMITY MMT:      MMT Left eval  Elbow flexion    Elbow extension    Wrist flexion 4/5  Wrist extension 4-/5 tender  Wrist ulnar deviation     Wrist radial deviation    Wrist pronation 4-/5 tender  Wrist supination 4-/5 tender  (Blank rows = not tested)   HAND FUNCTION: 03/29/22: Left grip: 25#  Eval: Grip strength Right: 74 lbs, Left: 17 lbs    COORDINATION: Eval: Box and Blocks Test: 54 Blocks today (54 is WFL, 73 is avg)   SENSATION: Eval:  Light touch intact today, though some hypersensitivity around tip of residual MF, though it has been improving    EDEMA:             Eval: 5.8 around distal residual tip of MF today   OBSERVATIONS:             Eval: she has little to no tenderness in wrist now, but some pain/tenderness with resistance and is stiff and weak still.  Her hand does not make tight fist yet, and though MF hypersensitivity is improving, it's still a bit painful, stiff, swollen, weak grip and pinch, etc.      TODAY'S TREATMENT:  04/05/22: She does AROM for exercise and new measures, showing improvement now that she is compliant with HEP and orthotics. OT discusses HEP and expectations, demo'ing and having her demo back understanding.   OT then adjusts dynamic finger orthotic to take pressure off at forearm and to allow MCP Js to bend to fullness. This fits and works slightly better now.   Next, for stubbornly stiff wrist, OT fabricates new forearm-based, dynamic flexion/extension static-progressive wrist orthotic. OT took care to add hinges to support carpal space and create proper leverage when in use. This is a complex brace that took about 60 minutes to make.  It fit well and she was able to get a "moderate" static stretch in both flexion and extension when using the brace. OT also supplies some extra materials in case the tension monofilament would break/snap.  She can also return for adjustments as needed.  She was asked to wear at least 3 time a day for 15 minute periods as tolerated, spend time doing flexion and spend time doing extension.  She states understanding and tolerates for some time in clinic as  well.    03/29/22: She does AROM and strength checks today, not making significant motion progress in wrist or fingers. OT asks if she's been faithful to 3-4 x day HEP and she states no she has not, ut she's continued "working on her house" and "shoveling" and such.  Shoveling may improve grip strength but not motion, OT warns her she must do HEP as asked or she will not make progress and will remain stiff chronically. OT is very strict about this message,   and she states understanding.  Custom orthotic fabrication was indicated due to pt's stiff hand and wrist and need for dynamic mobilization. OT fabricated custom dynamic finger flexion orthotic for pt today to passively stretch fingers and thumb 3x day for 15 minute periods as tolerated. It fit well with no areas of pressure, pt states a comfortable fit. Pt was educated on the wearing schedule, to remove and come  in ASAP if it is causing any irritation or is not achieving desired function. It will be checked/adjusted in upcoming sessions, as needed. Pt states understanding.  It may also be adjusted to include dynamic wrist flexion and ext (or perhaps a secondary orthotic will be needed for those).     PATIENT EDUCATION: Education details: See tx section above for details  Person educated: Patient Education method: Verbal Instruction, Teach back, Handouts  Education comprehension: States and demonstrates understanding, Additional Education required      HOME EXERCISE PROGRAM: Access Code: [email or web address] URL: [email or web address] Date: 03/23/2022 Prepared by: Benito Mccreedy   GOALS: Goals reviewed with patient? Yes     SHORT TERM GOALS: (STG required if POC>30 days)   Pt will obtain protective, custom orthotic and also mobilizing orthotics as needed. Target date: 04/01/22 Goal status: 04/05/22: MET   2.  Pt will demo/state understanding of initial HEP to improve pain levels and prerequisite motion. Target date: 03/23/22 Goal  status: MET 03/23/22     LONG TERM GOALS:   Pt will improve functional ability by decreased impairment per PSFS assessment from 4.6 to 7 or better, for better quality of life. Target date: 05/06/22 Goal status: INITIAL   2.  Pt will improve grip strength in left hand from 17lbs to at least 40lbs for functional use at home and in IADLs. Target date: 05/06/22 Goal status: INITIAL   3.  Pt will improve A/ROM in left wrist flex and ext from 33*/34* to at least 45* each, to have functional motion for tasks like reach and grasp.  Target date: 05/06/22 Goal status: INITIAL   4.  Pt will improve strength in left wrist from tender 4-/5 MMT to at least 4+/5 MMT to have increased functional ability to carry out selfcare and higher-level homecare tasks with no difficulty. Target date: 05/06/22 Goal status: INITIAL     ASSESSMENT:   CLINICAL IMPRESSION: 04/05/22: She now has all needed dynamic and static-progressive orthoses for stubborn finger and wrist motion. She also has HEP. She just needs to be compliant with program to get best results. Will f/u to check orthotics and motion as well as upgrade HEP as able/needed.  03/29/22: Her prognosis will be poor if she continues to choose to work on house projects rather that do frequent stretches and exercises as OT has asked her to do. Continue on   Eval: Patient is a 56 y.o. female who was seen today for occupational therapy evaluation for left wrist stiffness, weakness, and left MF pain and management of decreased function after amputation. She will benefit from OP OT to help increase quality of life.      PLAN: OT FREQUENCY: 1-2x/week (tapering as appropriate)    OT DURATION: 6 weeks (through 05/06/22)    PLANNED INTERVENTIONS: self care/ADL training, therapeutic exercise, therapeutic activity, neuromuscular re-education, manual therapy, scar mobilization, passive range of motion, splinting, electrical stimulation, fluidotherapy, compression  bandaging, moist heat, cryotherapy, contrast bath, patient/family education, coping strategies training, and DME and/or AE instructions   RECOMMENDED OTHER SERVICES: none now    CONSULTED AND  AGREED WITH PLAN OF CARE: Patient   PLAN FOR NEXT SESSION:  Will f/u to check orthotics and motion as well as upgrade HEP as able/needed. Upgrade as tolerated to whole arm strength as tolerated. Perform manual therapy as needed to help mobilize and decrease pain. Consider adding DTM and other in-hand manipulations to work on FMS in left hand as well.    Nathanael Moore, OTR/L, CHT 04/05/2022, 4:45 PM       

## 2022-04-13 ENCOUNTER — Encounter: Payer: Self-pay | Admitting: Rehabilitative and Restorative Service Providers"

## 2022-04-13 ENCOUNTER — Ambulatory Visit (INDEPENDENT_AMBULATORY_CARE_PROVIDER_SITE_OTHER): Payer: Commercial Managed Care - HMO | Admitting: Rehabilitative and Restorative Service Providers"

## 2022-04-13 DIAGNOSIS — M25642 Stiffness of left hand, not elsewhere classified: Secondary | ICD-10-CM | POA: Diagnosis not present

## 2022-04-13 DIAGNOSIS — R202 Paresthesia of skin: Secondary | ICD-10-CM | POA: Diagnosis not present

## 2022-04-13 DIAGNOSIS — M25632 Stiffness of left wrist, not elsewhere classified: Secondary | ICD-10-CM

## 2022-04-13 DIAGNOSIS — M79645 Pain in left finger(s): Secondary | ICD-10-CM

## 2022-04-13 DIAGNOSIS — M6281 Muscle weakness (generalized): Secondary | ICD-10-CM

## 2022-04-13 DIAGNOSIS — M25532 Pain in left wrist: Secondary | ICD-10-CM | POA: Diagnosis not present

## 2022-04-13 DIAGNOSIS — M25542 Pain in joints of left hand: Secondary | ICD-10-CM

## 2022-04-13 NOTE — Therapy (Addendum)
OUTPATIENT OCCUPATIONAL THERAPY TREATMENT & DISCHARGE NOTE   Patient Name: Mary Rogers MRN: [id number] DOB:1965/12/20, 56 y.o., female Today's Date: 04/13/2022  PCP: N/A  REFERRING PROVIDER: Dr. Sherilyn Cooter   END OF SESSION:   OT End of Session - 04/13/22 0931     Visit Number 4    Number of Visits 10    Date for OT Re-Evaluation 05/06/22    OT Start Time 0931    OT Stop Time 1044    OT Time Calculation (min) 73 min    Equipment Utilized During Treatment orthotic materials    Activity Tolerance Patient tolerated treatment well;No increased pain    Behavior During Therapy WFL for tasks assessed/performed               Past Medical History:  Diagnosis Date   Diverticulitis    Fatty liver    Neurogenic bladder    Past Surgical History:  Procedure Laterality Date   AMPUTATION Left 01/24/2022   Procedure: REVISION AMPUTATION LEFT MIDDLE FINGER;  Surgeon: Sherilyn Cooter, MD;  Location: [facility];  Service: Orthopedics;  Laterality: Left;   CESAREAN SECTION     CHOLECYSTECTOMY     COLON SURGERY     Patient Active Problem List   Diagnosis Date Noted   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    Open displaced fracture of distal phalanx of finger of left hand 01/14/2022   Laceration of left middle finger w/o foreign body w/o damage to nail 01/14/2022   Laceration of left index finger 01/07/2022   Fracture, Colles, left, closed 11/16/2021    ONSET DATE: 10/30/21 DOI (wrist fx), 01/24/22 DOS (amputation)    REFERRING DIAG: W09.811B (ICD-10-CM) - Partial traumatic amputation of left middle finger through phalanx, initial encounter   THERAPY DIAG:  Pain in left wrist  Stiffness of left wrist, not elsewhere classified  Paresthesia of skin  Stiffness of left hand, not elsewhere classified  Muscle weakness (generalized)  Pain in joints of left hand  Pain in left finger(s)  Rationale for Evaluation and Treatment  Rehabilitation  PERTINENT HISTORY: Per MD: "Left hand and wrist stiffness.  L DRF (closed Colles') treated nonoperatively.  L middle finger amputation." Amputation through distal P2 of L MF.    PRECAUTIONS: 11 weeks s/p amputation revision, 22+ weeks out from Lt DRF with conservative management    WEIGHT BEARING RESTRICTIONS No  SUBJECTIVE:  She has several small cuts on Left hand today, states cutting up on chicken wire a bit. She states not wearing wrist dynamic orthotic bc she couldn't feel the stretches, but did wear her dynamic finger orthotic. She states "I'm more worried about my knee than my hand," and she describes possible ACL injury to Lt knee ~1 month ago for which she has not sought out treatment.     PAIN:  Are you having pain? No- just stiff, slight pain when pushing on it Rating: 0/10 at rest now    OBJECTIVE: (All objective assessments below are from initial evaluation on: 03/23/22 unless otherwise specified.)   HAND DOMINANCE: Right    ADLs: Overall ADLs: States decreased ability to grab, hold household objects, pain and inability to open containers, perform all FMS tasks, etc.      FUNCTIONAL OUTCOME MEASURES: Eval: Patient Specific Functional Scale: 4.6 (wash a glass, turn fork lift wheel (spinner knob), opening jars)   UPPER EXTREMITY ROM      Active ROM Left eval Left 03/29/22 Left 04/05/22 Left  04/13/22  Wrist flexion 33 31 44 31  Wrist extension 41 42 45 49  Wrist ulnar deviation 19   15  Wrist radial deviation 15   18  Wrist pronation 60   66  Wrist supination 69   73  (Blank rows = not tested)   Active ROM Left eval Left 03/29/22 Left 04/05/22 Left  04/13/22  Thumb MCP (0-60) 25  31 31   Thumb IP (0-80) 41  46 46  Thumb Opposition to Small Finger Yes, but 2.5cm from base of SF   3cm gap to base today 2.5cm gap to base of SF today  Index MCP (0-90) 0-65  0-62 0 -66 0- 75  Index PIP (0-100)  0-90 0-94 0 -96 0- 94  Index DIP (0-70) 0-56  0-58 0 -61  0- 59  Long MCP (0-90) 0-75   0 -79 0-81  Long PIP (0-100) 0-78   0 - 84 0-87  (Blank rows = not tested)     UPPER EXTREMITY MMT:      MMT Left eval  Elbow flexion    Elbow extension    Wrist flexion 4/5  Wrist extension 4-/5 tender  Wrist ulnar deviation    Wrist radial deviation    Wrist pronation 4-/5 tender  Wrist supination 4-/5 tender  (Blank rows = not tested)   HAND FUNCTION: 04/13/22: Left grip: 33#  03/29/22: Left grip: 25#  Eval: Grip strength Right: 74 lbs, Left: 17 lbs    COORDINATION: Eval: Box and Blocks Test: 54 Blocks today (54 is WFL, 73 is avg)   SENSATION: Eval:  Light touch intact today, though some hypersensitivity around tip of residual MF, though it has been improving    EDEMA:             Eval: 5.8 around distal residual tip of MF today   OBSERVATIONS:             Eval: she has little to no tenderness in wrist now, but some pain/tenderness with resistance and is stiff and weak still.  Her hand does not make tight fist yet, and though MF hypersensitivity is improving, it's still a bit painful, stiff, swollen, weak grip and pinch, etc.      TODAY'S TREATMENT:  04/13/22: She does AROM and gripping to start.  AROM is improved everywhere except wrist flexion, which is worse now. OT reviews HEP recommendations and then OT has to made several adjustments to both finger and wrist dynamic orthotics today: to prevent new pinching places (wrist orthotic); monofilament wires exchanged with elastic thread in wrist orthotic and secondary/overlying strap added to increase pull of tension in wrist orthotic; a new MCP J blocking strap to increase IP J PROM in hand orthotic or be used for passive claw stretch within orthotic. After these changes she is still able to don/doff orthotics herself, states having a medium to strong dynamic pull at wrist in both flexion and ext with no pinching, also medium to strong pull at fingers at MCPJ or IP Js (depending on wear) with no  issues or fingers crossing over, still aligned to scaphoid, etc. She states these will work and that she is comfortable doing HEP and using these for the next 2 weeks before f/u again with OT.   OT also advises to use caution for knee, checks and doesn't find much instability but slight pain with posterior translation. Advises not do anything painful, f/u with MD if not getting better in 2-4 weeks advised  for knee.     PATIENT EDUCATION: Education details: See tx section above for details  Person educated: Patient Education method: Verbal Instruction, Teach back, Handouts  Education comprehension: States and demonstrates understanding, Additional Education required      HOME EXERCISE PROGRAM: Access Code: [email or web address] URL: [email or web address] Date: 03/23/2022 Prepared by: Benito Mccreedy   GOALS: Goals reviewed with patient? Yes     SHORT TERM GOALS: (STG required if POC>30 days)   Pt will obtain protective, custom orthotic and also mobilizing orthotics as needed. Target date: 04/01/22 Goal status: 04/05/22: MET   2.  Pt will demo/state understanding of initial HEP to improve pain levels and prerequisite motion. Target date: 03/23/22 Goal status: MET 03/23/22     LONG TERM GOALS:   Pt will improve functional ability by decreased impairment per PSFS assessment from 4.6 to 7 or better, for better quality of life. Target date: 05/06/22 Goal status: INITIAL   2.  Pt will improve grip strength in left hand from 17lbs to at least 40lbs for functional use at home and in IADLs. Target date: 05/06/22 Goal status: INITIAL   3.  Pt will improve A/ROM in left wrist flex and ext from 33*/34* to at least 45* each, to have functional motion for tasks like reach and grasp.  Target date: 05/06/22 Goal status: INITIAL   4.  Pt will improve strength in left wrist from tender 4-/5 MMT to at least 4+/5 MMT to have increased functional ability to carry out selfcare and higher-level  homecare tasks with no difficulty. Target date: 05/06/22 Goal status: INITIAL     ASSESSMENT:   CLINICAL IMPRESSION: 04/13/22: Orthotics are all working well, as intended now, and she has full HEP and understanding. She will do these things for 2 weeks, then f/u to determine effect and any additional needs/reassessment, etc.      PLAN: OT FREQUENCY: 1-2x/week (tapering as appropriate)    OT DURATION: 6 weeks (through 05/06/22)    PLANNED INTERVENTIONS: self care/ADL training, therapeutic exercise, therapeutic activity, neuromuscular re-education, manual therapy, scar mobilization, passive range of motion, splinting, electrical stimulation, fluidotherapy, compression bandaging, moist heat, cryotherapy, contrast bath, patient/family education, coping strategies training, and DME and/or AE instructions   RECOMMENDED OTHER SERVICES: none now    CONSULTED AND AGREED WITH PLAN OF CARE: Patient   PLAN FOR NEXT SESSION:  Reassess in 2 weeks, check goals and needs. She will be maximizing therapy benefit to wrist most likely (as it is becoming chronic issue).    Benito Mccreedy, OTR/L, CHT 04/13/2022, 11:14 AM    OCCUPATIONAL THERAPY DISCHARGE SUMMARY  Visits from Start of Care: 4  She did not follow up with OT as discussed, has not reached out for months, etc., but she was doing very well when last seen. Goals could not be reviewed, see notes for details. She is d/c from OT now,but could possibly return in future if needed.  Benito Mccreedy, OTR/L, CHT 06/08/22

## 2022-04-27 ENCOUNTER — Encounter: Payer: Commercial Managed Care - HMO | Admitting: Rehabilitative and Restorative Service Providers"

## 2023-06-14 IMAGING — DX DG WRIST COMPLETE 3+V*L*
4 series · 4 of 4 positions shown · non-contrast
Comparison: Left wrist radiographs 12/03/2021

CLINICAL DATA: Left wrist pain.  Fracture follow-up.

EXAM:
LEFT WRIST - COMPLETE 3+ VIEW

[wrist ap]
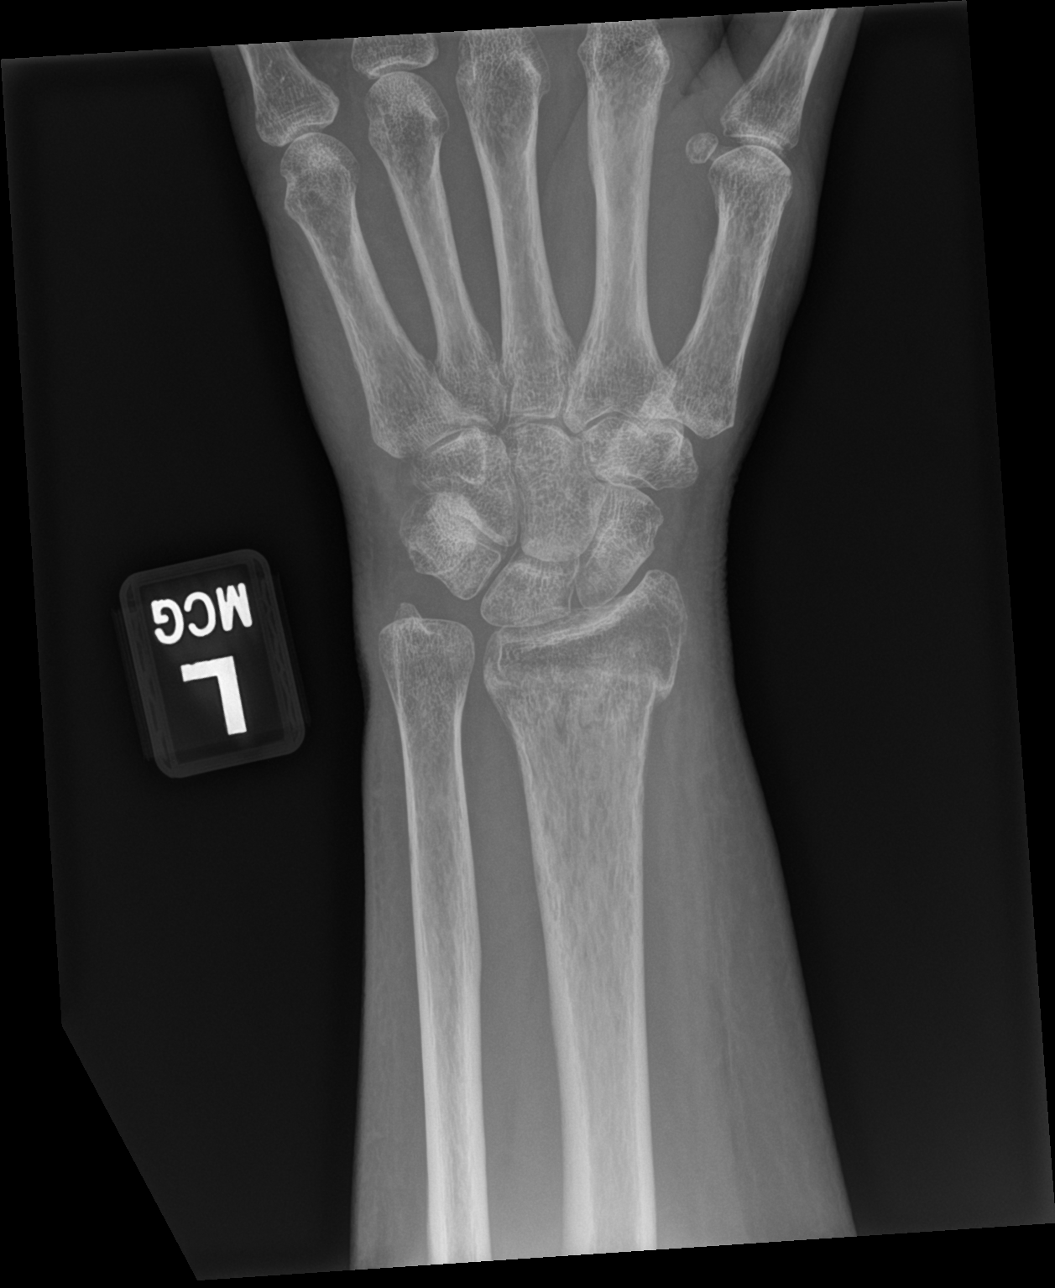

[wrist obl]
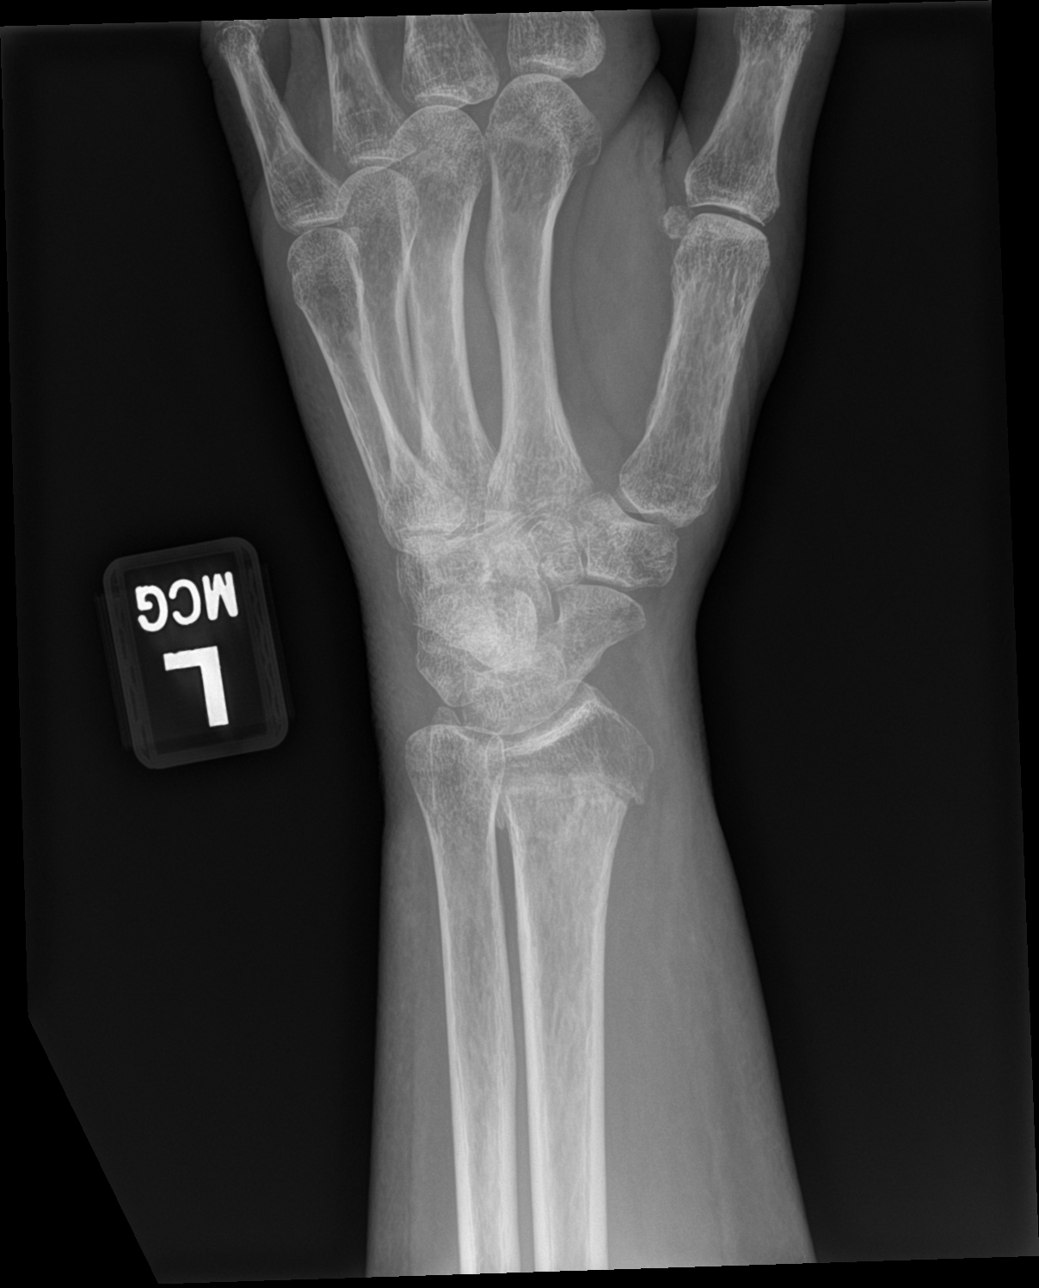

[wrist lat]
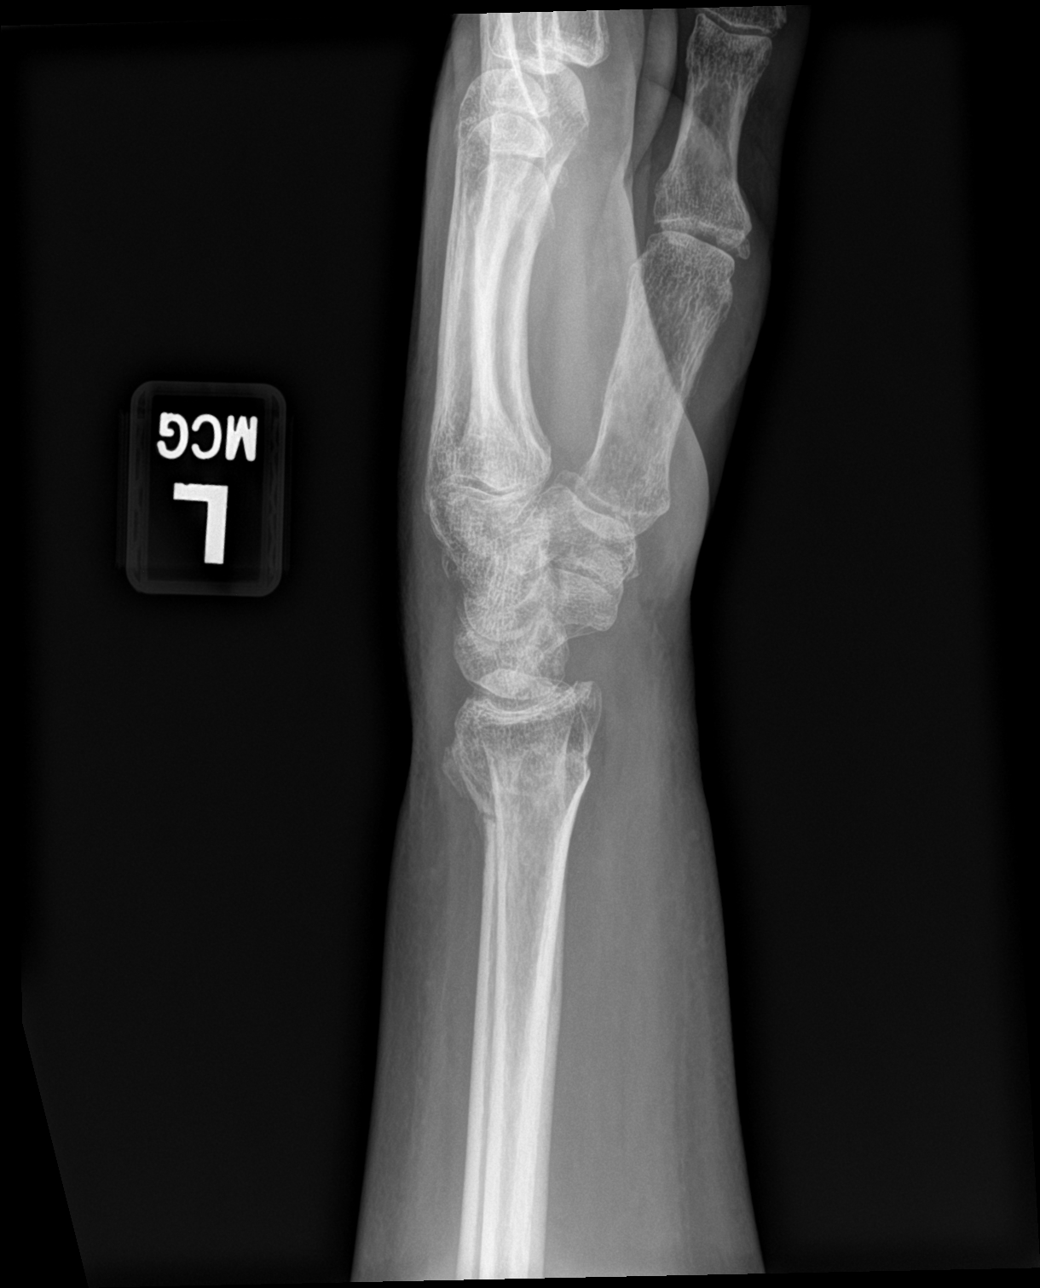

[scaphoid]
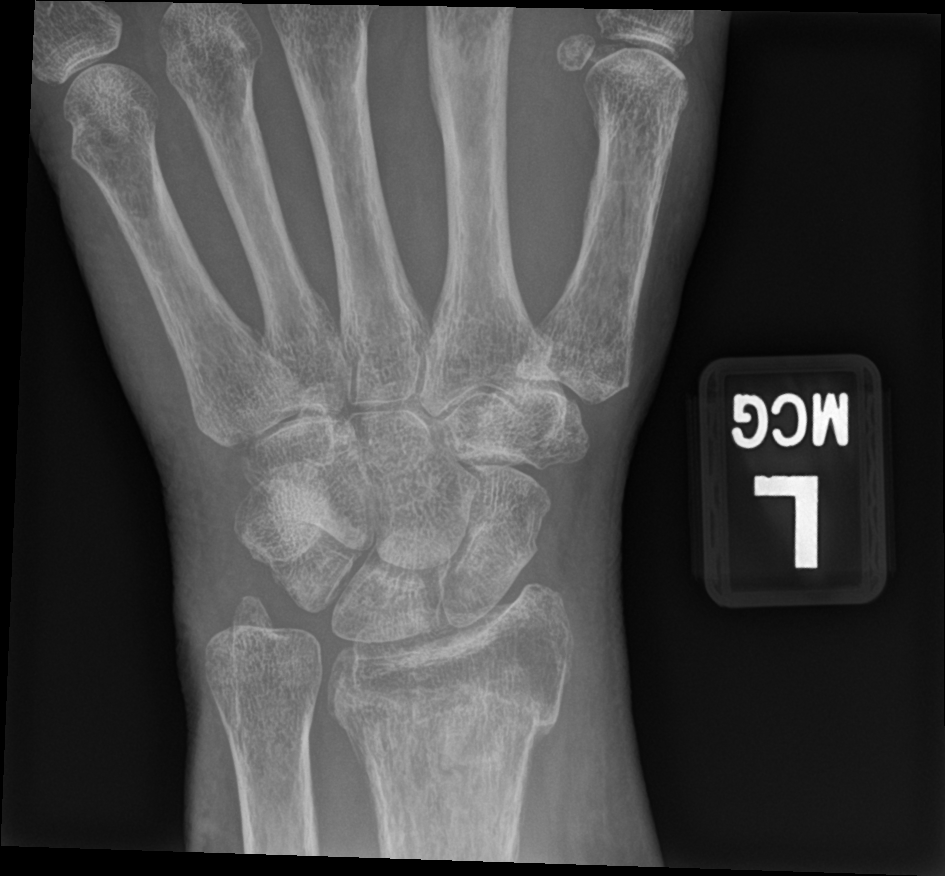

[4 of 4 positions shown; findings below may reference images not displayed]

FINDINGS: Mild interval healing sclerosis within the previously seen
transverse fracture of the distal radial metaphysis and physis.
Unchanged mild impaction. Unchanged mild 2 mm cortical step-off at
the lateral aspect of the fracture line on frontal view. Minimal
volar apex angulation of the fracture on lateral view is unchanged.
Mildly decreased moderate regional soft tissue swelling.

Diffuse decreased bone mineralization. Unchanged mild ulnar positive
variance.
IMPRESSION: Mild early healing of subacute fracture of the distal radial
metaphysis and physis.

## 2023-06-21 IMAGING — DX DG HAND COMPLETE 3+V*L*
1 series · 4 of 4 positions shown · non-contrast
Comparison: 12/30/2021

CLINICAL DATA: Lacerations to fingers from salt.

EXAM:
LEFT HAND - COMPLETE 3+ VIEW

[Series 1: hand · 0.14mm/px · 4 of 4 slices shown]
[im 1/4]
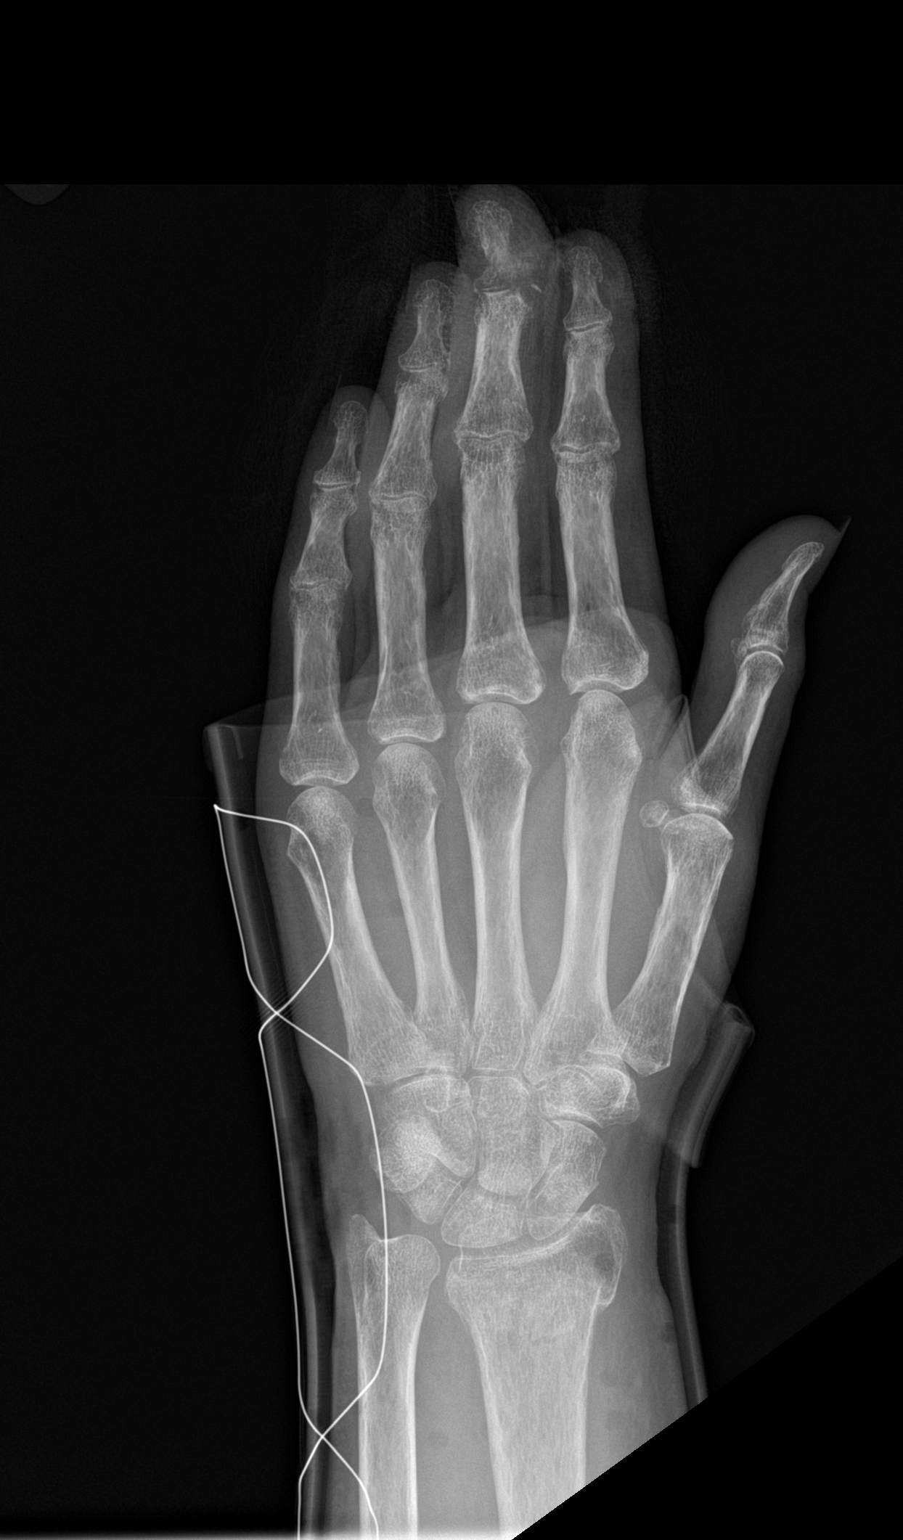
[im 2/4]
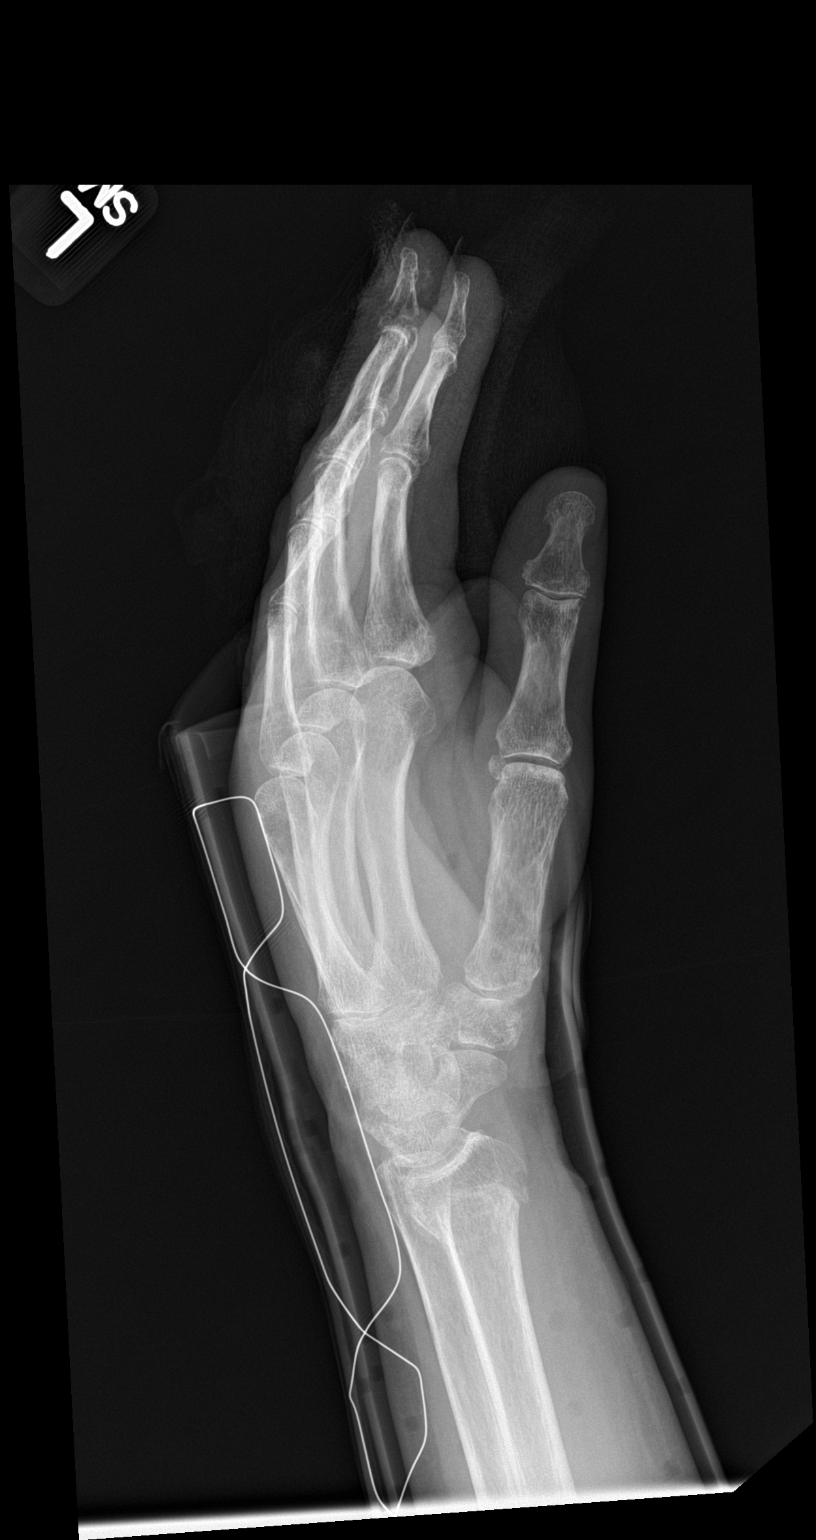
[im 3/4]
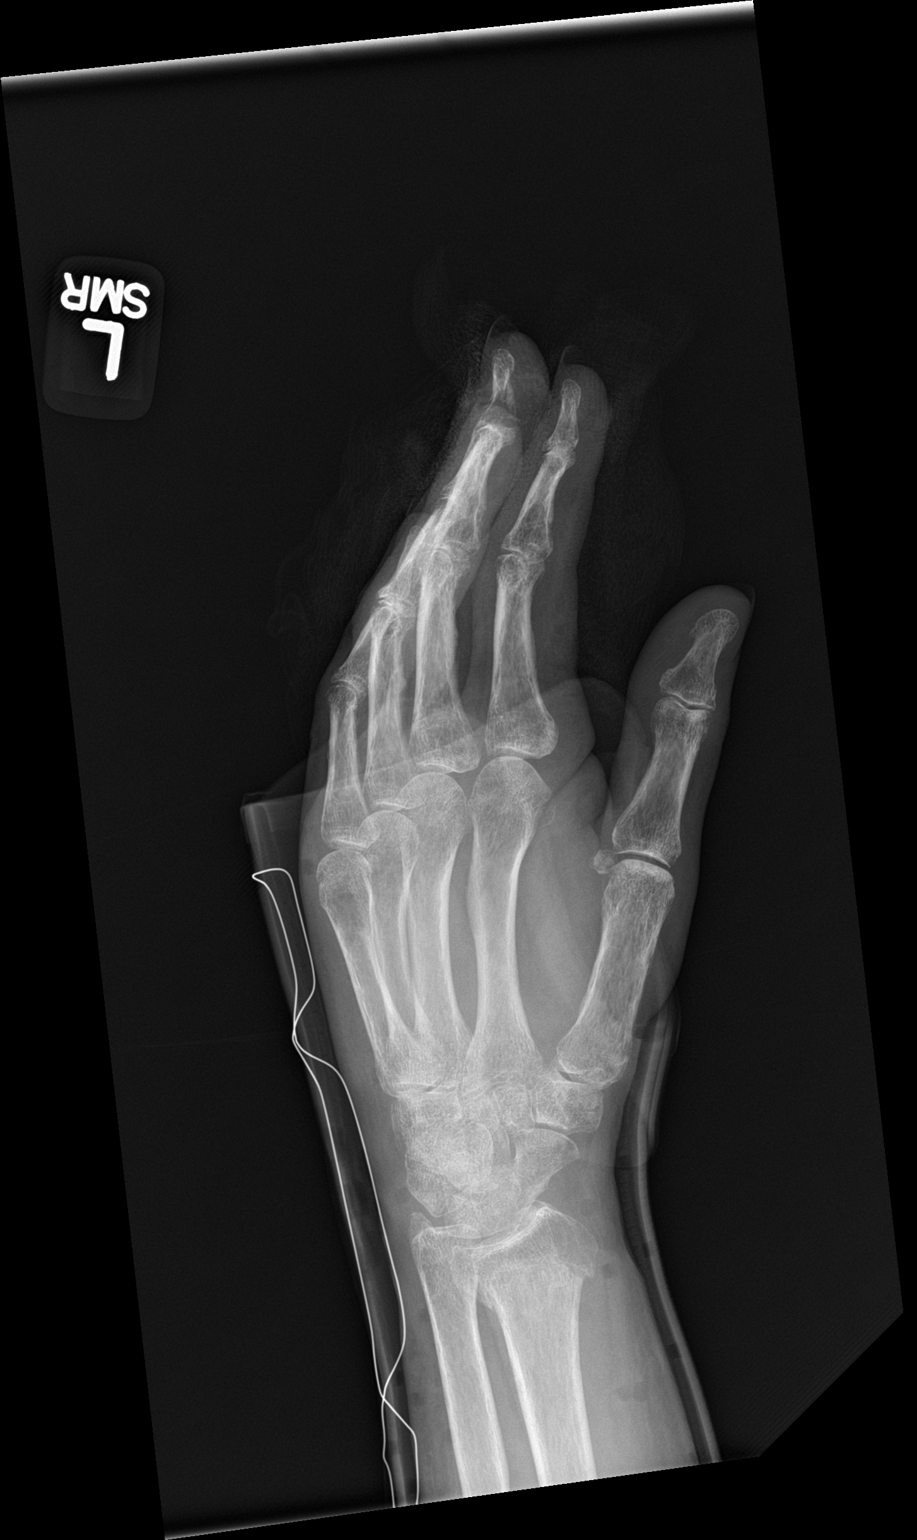
[im 4/4]
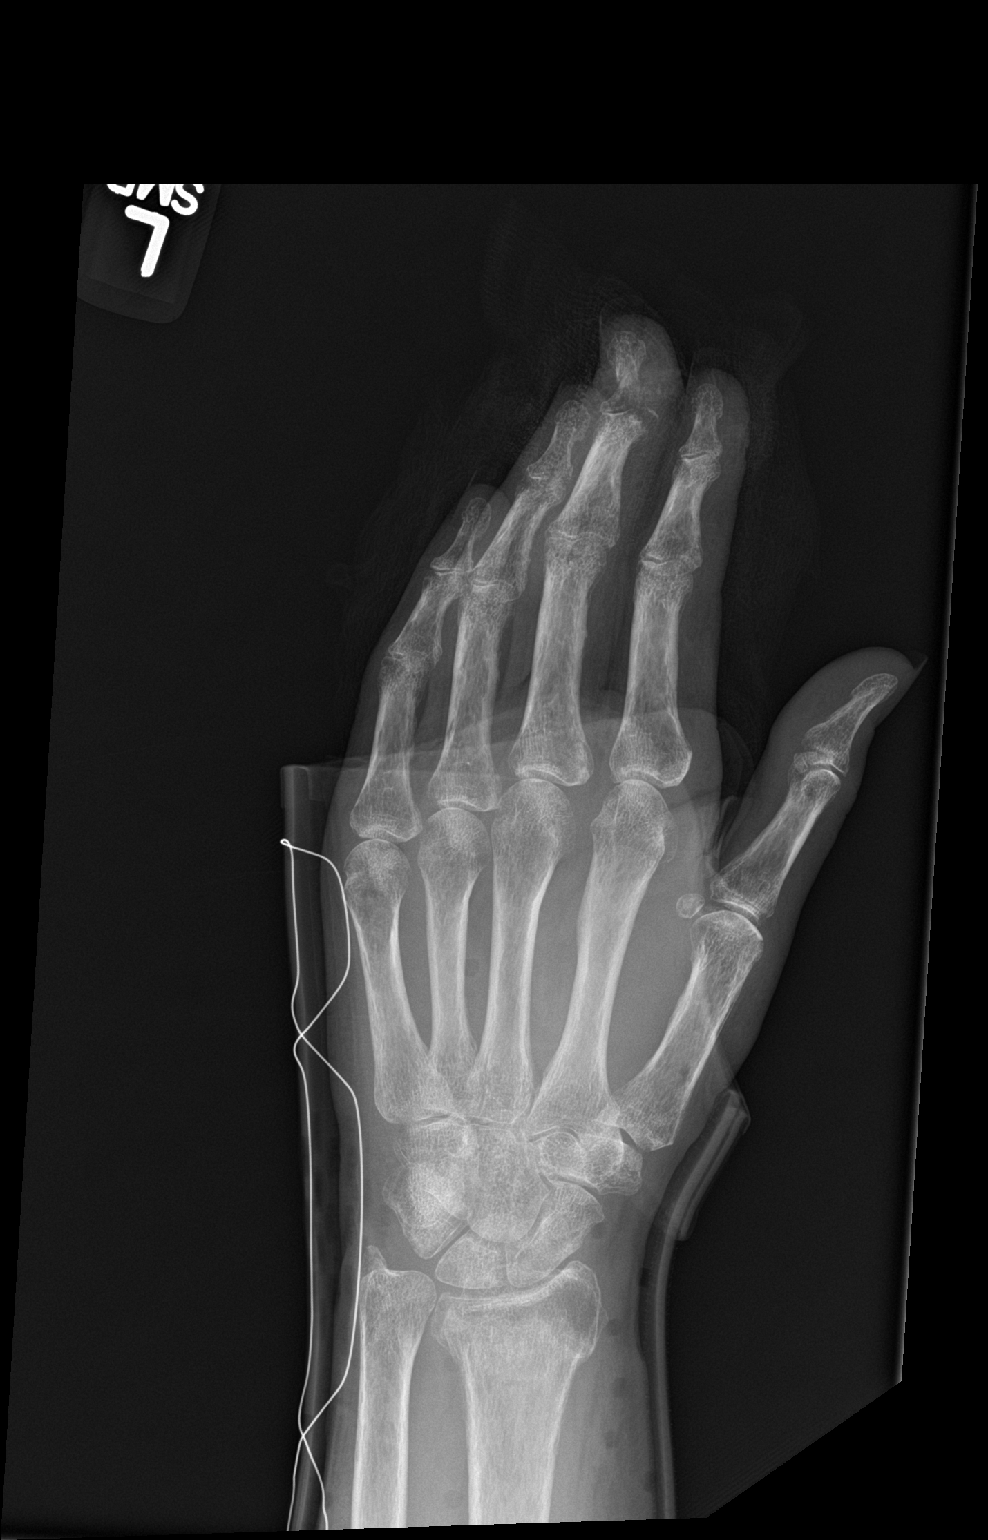

[4 of 4 positions shown; findings below may reference images not displayed]

FINDINGS: Healing fracture in the distal left radial metaphysis again noted as
seen on prior study. Fracture noted through the left middle finger
distal phalanx and distal aspect of the middle phalanx. No
additional fractures. No subluxation or dislocation.
IMPRESSION: Fractures in the left middle finger distal phalanx and distal aspect
of the middle phalanx at the DIP joint.

Healing subacute fracture in the distal left radial metaphysis.
